# Patient Record
Sex: Male | Born: 1950 | Race: Black or African American | Hispanic: No | Smoking: Current some day smoker
Health system: Southern US, Community
[De-identification: ages and names within clinical notes are randomized; demographics above are authoritative.]

## PROBLEM LIST (undated history)

## (undated) DIAGNOSIS — C801 Malignant (primary) neoplasm, unspecified: Secondary | ICD-10-CM

## (undated) DIAGNOSIS — M1990 Unspecified osteoarthritis, unspecified site: Secondary | ICD-10-CM

## (undated) DIAGNOSIS — J189 Pneumonia, unspecified organism: Secondary | ICD-10-CM

## (undated) DIAGNOSIS — K56600 Partial intestinal obstruction, unspecified as to cause: Secondary | ICD-10-CM

## (undated) DIAGNOSIS — IMO0002 Reserved for concepts with insufficient information to code with codable children: Secondary | ICD-10-CM

## (undated) DIAGNOSIS — M329 Systemic lupus erythematosus, unspecified: Secondary | ICD-10-CM

## (undated) HISTORY — DX: Partial intestinal obstruction, unspecified as to cause: K56.600

## (undated) HISTORY — PX: NECK SURGERY: SHX720

## (undated) HISTORY — DX: Malignant (primary) neoplasm, unspecified: C80.1

## (undated) HISTORY — DX: Reserved for concepts with insufficient information to code with codable children: IMO0002

## (undated) HISTORY — DX: Systemic lupus erythematosus, unspecified: M32.9

## (undated) HISTORY — PX: BACK SURGERY: SHX140

## (undated) HISTORY — DX: Pneumonia, unspecified organism: J18.9

## (undated) HISTORY — PX: OTHER SURGICAL HISTORY: SHX169

---

## 2013-04-23 ENCOUNTER — Emergency Department (HOSPITAL_COMMUNITY): Payer: Medicaid Other

## 2013-04-23 ENCOUNTER — Encounter (HOSPITAL_COMMUNITY): Payer: Self-pay

## 2013-04-23 ENCOUNTER — Inpatient Hospital Stay (HOSPITAL_COMMUNITY)
Admission: EM | Admit: 2013-04-23 | Discharge: 2013-04-26 | DRG: 390 | Disposition: A | Discharge status: Home / Self Care | Payer: Medicaid Other | Attending: General Surgery | Admitting: General Surgery

## 2013-04-23 DIAGNOSIS — IMO0002 Reserved for concepts with insufficient information to code with codable children: Secondary | ICD-10-CM

## 2013-04-23 DIAGNOSIS — R0902 Hypoxemia: Secondary | ICD-10-CM

## 2013-04-23 DIAGNOSIS — M329 Systemic lupus erythematosus, unspecified: Secondary | ICD-10-CM | POA: Diagnosis present

## 2013-04-23 DIAGNOSIS — J984 Other disorders of lung: Secondary | ICD-10-CM | POA: Diagnosis present

## 2013-04-23 DIAGNOSIS — K56609 Unspecified intestinal obstruction, unspecified as to partial versus complete obstruction: Secondary | ICD-10-CM

## 2013-04-23 DIAGNOSIS — R911 Solitary pulmonary nodule: Secondary | ICD-10-CM

## 2013-04-23 DIAGNOSIS — K56600 Partial intestinal obstruction, unspecified as to cause: Secondary | ICD-10-CM

## 2013-04-23 DIAGNOSIS — R918 Other nonspecific abnormal finding of lung field: Secondary | ICD-10-CM

## 2013-04-23 DIAGNOSIS — Z79899 Other long term (current) drug therapy: Secondary | ICD-10-CM

## 2013-04-23 DIAGNOSIS — F17200 Nicotine dependence, unspecified, uncomplicated: Secondary | ICD-10-CM | POA: Diagnosis present

## 2013-04-23 DIAGNOSIS — K565 Intestinal adhesions [bands], unspecified as to partial versus complete obstruction: Principal | ICD-10-CM | POA: Diagnosis present

## 2013-04-23 DIAGNOSIS — R109 Unspecified abdominal pain: Secondary | ICD-10-CM

## 2013-04-23 HISTORY — DX: Unspecified osteoarthritis, unspecified site: M19.90

## 2013-04-23 HISTORY — DX: Partial intestinal obstruction, unspecified as to cause: K56.600

## 2013-04-23 LAB — POCT I-STAT, CHEM 8
BUN: 7 mg/dL (ref 6–23)
Calcium, Ion: 1.05 mmol/L — ABNORMAL LOW (ref 1.13–1.30)
Chloride: 98 mEq/L (ref 96–112)
Creatinine, Ser: 1.3 mg/dL (ref 0.50–1.35)
Glucose, Bld: 86 mg/dL (ref 70–99)
Sodium: 140 mEq/L (ref 135–145)
TCO2: 30 mmol/L (ref 0–100)

## 2013-04-23 LAB — COMPREHENSIVE METABOLIC PANEL
ALT: 14 U/L (ref 0–53)
AST: 30 U/L (ref 0–37)
Albumin: 4.2 g/dL (ref 3.5–5.2)
CO2: 26 mEq/L (ref 19–32)
Chloride: 95 mEq/L — ABNORMAL LOW (ref 96–112)
Creatinine, Ser: 0.78 mg/dL (ref 0.50–1.35)
Potassium: 3.7 mEq/L (ref 3.5–5.1)
Sodium: 139 mEq/L (ref 135–145)
Total Bilirubin: 0.4 mg/dL (ref 0.3–1.2)
Total Protein: 8.5 g/dL — ABNORMAL HIGH (ref 6.0–8.3)

## 2013-04-23 LAB — CBC
MCHC: 36 g/dL (ref 30.0–36.0)
Platelets: 146 10*3/uL — ABNORMAL LOW (ref 150–400)
RBC: 4.12 MIL/uL — ABNORMAL LOW (ref 4.22–5.81)
RDW: 14 % (ref 11.5–15.5)
WBC: 7.2 10*3/uL (ref 4.0–10.5)

## 2013-04-23 LAB — URINALYSIS, ROUTINE W REFLEX MICROSCOPIC
Glucose, UA: NEGATIVE mg/dL
Hgb urine dipstick: NEGATIVE
Ketones, ur: NEGATIVE mg/dL
Leukocytes, UA: NEGATIVE
pH: 8.5 — ABNORMAL HIGH (ref 5.0–8.0)

## 2013-04-23 LAB — POCT I-STAT TROPONIN I: Troponin i, poc: 0 ng/mL (ref 0.00–0.08)

## 2013-04-23 MED ORDER — SODIUM CHLORIDE 0.9 % IV SOLN
INTRAVENOUS | Status: DC
  Administered 2013-04-23: 900 mL via INTRAVENOUS
  Administered 2013-04-23: 05:00:00 via INTRAVENOUS

## 2013-04-23 MED ORDER — ONDANSETRON HCL 4 MG/2ML IJ SOLN
4.0000 mg | Freq: Four times a day (QID) | INTRAMUSCULAR | Status: DC | PRN
  Administered 2013-04-23: 4 mg via INTRAVENOUS
  Filled 2013-04-23: qty 2

## 2013-04-23 MED ORDER — SODIUM CHLORIDE 0.9 % IV SOLN
INTRAVENOUS | Status: DC
  Administered 2013-04-23 – 2013-04-24 (×4): via INTRAVENOUS

## 2013-04-23 MED ORDER — ENOXAPARIN SODIUM 40 MG/0.4ML ~~LOC~~ SOLN
40.0000 mg | SUBCUTANEOUS | Status: DC
  Administered 2013-04-23 – 2013-04-26 (×4): 40 mg via SUBCUTANEOUS
  Filled 2013-04-23 (×5): qty 0.4

## 2013-04-23 MED ORDER — HYDROMORPHONE HCL PF 1 MG/ML IJ SOLN
1.0000 mg | Freq: Once | INTRAMUSCULAR | Status: AC
  Administered 2013-04-23: 1 mg via INTRAVENOUS
  Filled 2013-04-23: qty 1

## 2013-04-23 MED ORDER — PANTOPRAZOLE SODIUM 40 MG IV SOLR
40.0000 mg | Freq: Every day | INTRAVENOUS | Status: DC
  Administered 2013-04-23 – 2013-04-25 (×3): 40 mg via INTRAVENOUS
  Filled 2013-04-23 (×4): qty 40

## 2013-04-23 MED ORDER — ACETAMINOPHEN 650 MG RE SUPP: 650.0000 mg | Freq: Four times a day (QID) | RECTAL | Status: DC | PRN

## 2013-04-23 MED ORDER — MORPHINE SULFATE 2 MG/ML IJ SOLN
2.0000 mg | INTRAMUSCULAR | Status: DC | PRN
  Administered 2013-04-23 – 2013-04-24 (×5): 2 mg via INTRAVENOUS
  Filled 2013-04-23 (×4): qty 1

## 2013-04-23 MED ORDER — WHITE PETROLATUM GEL
Status: AC
  Administered 2013-04-23: 12:00:00
  Filled 2013-04-23: qty 5

## 2013-04-23 MED ORDER — ACETAMINOPHEN 325 MG PO TABS: 650.0000 mg | ORAL_TABLET | Freq: Four times a day (QID) | ORAL | Status: DC | PRN

## 2013-04-23 MED ORDER — ONDANSETRON HCL 4 MG/2ML IJ SOLN
4.0000 mg | Freq: Once | INTRAMUSCULAR | Status: AC
  Administered 2013-04-23: 4 mg via INTRAVENOUS
  Filled 2013-04-23: qty 2

## 2013-04-23 MED ORDER — IOHEXOL 300 MG/ML  SOLN
80.0000 mL | Freq: Once | INTRAMUSCULAR | Status: AC | PRN
  Administered 2013-04-23: 80 mL via INTRAVENOUS

## 2013-04-23 NOTE — Progress Notes (Signed)
SCDs placed bilaterally per order, provided Incentive Spirometer and reinforced how to use, patient states has used previously, Berle Mull RN

## 2013-04-23 NOTE — ED Notes (Signed)
Pt started having abdominal pain last night about 2300 after he ate and did not have a bowel movement yesterday.  Pain is in the center of his abdomen

## 2013-04-23 NOTE — ED Notes (Signed)
Pt given a urinal and is aware a specimen is needed.

## 2013-04-23 NOTE — ED Notes (Signed)
Attempted report, said will call back in 5 mins.

## 2013-04-23 NOTE — Progress Notes (Signed)
Called Dr. Molli Knock who has agreed to see the patient regarding his 15mm left lung cavitary lesion (can not r/o malignancy), atelectasis vs consolidation in RLL, and COPD changes.
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
## 2013-04-23 NOTE — Progress Notes (Addendum)
Received to room 6N-10 from emergenecy dept, alert and oriented, significant other at bedside, placement of NGT verified by auscultation, placed to low intermittent wall suction, only scant amounts of clear drainage noted,oriented to room and unit, provided nonslip socks, urinal, and mouth care supplied, NGT secured to gown with safety pin, patient and family appear to understand plan of care, Berle Mull RN  Patient does have "skin condition" noted to top of head and forehead, scaly and lack of pigmentation, pt states is "lupus", denies treatment or diagnosis of systemic lupus, Berle Mull RN
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
## 2013-04-24 ENCOUNTER — Inpatient Hospital Stay (HOSPITAL_COMMUNITY): Payer: Medicaid Other

## 2013-04-24 LAB — CBC
HCT: 40.3 % (ref 39.0–52.0)
Hemoglobin: 14 g/dL (ref 13.0–17.0)
MCH: 32.3 pg (ref 26.0–34.0)
MCHC: 34.7 g/dL (ref 30.0–36.0)
MCV: 93.1 fL (ref 78.0–100.0)
Platelets: 152 10*3/uL (ref 150–400)
RBC: 4.33 MIL/uL (ref 4.22–5.81)
RDW: 14.5 % (ref 11.5–15.5)
WBC: 6 10*3/uL (ref 4.0–10.5)

## 2013-04-24 LAB — BASIC METABOLIC PANEL
BUN: 11 mg/dL (ref 6–23)
CO2: 24 mEq/L (ref 19–32)
Chloride: 103 mEq/L (ref 96–112)
Creatinine, Ser: 0.69 mg/dL (ref 0.50–1.35)
GFR calc non Af Amer: >90 mL/min (ref >90)
Glucose, Bld: 93 mg/dL (ref 70–99)

## 2013-04-24 NOTE — Progress Notes (Signed)
Subjective: Less abdominal pain. No nausea since NGT fell out.  Back from chest CT.  Objective: Vital signs in last 24 hours: Temp:  [98.3 F (36.8 C)-98.9 F (37.2 C)] 98.3 F (36.8 C) (10/04 0542) Pulse Rate:  [65-71] 69 (10/04 0542) Resp:  [18-20] 18 (10/04 0542) BP: (122-143)/(57-87) 143/87 mmHg (10/04 0542) SpO2:  [94 %-95 %] 95 % (10/04 0542) Weight:  [141 lb (63.957 kg)] 141 lb (63.957 kg) (10/03 1100) Last BM Date: 04/21/13  Intake/Output from previous day: 10/03 0701 - 10/04 0700 In: 1556.7 [I.V.:1526.7; NG/GT:30] Out: 1075 [Urine:775; Emesis/NG output:300] Intake/Output this shift:    ABD: soft NT ND.  BS present.   Lab Results:   Recent Labs  04/23/13 0427 04/24/13 0642  WBC 7.2 6.0  HGB 13.5 14.0  HCT 37.5* 40.3  PLT 146* 152   BMET  Recent Labs  04/23/13 0427 04/24/13 0642  NA 139 144  K 3.7 4.0  CL 95* 103  CO2 26 24  GLUCOSE 81 93  BUN 9 11  CREATININE 0.78 0.69  CALCIUM 9.8 8.6   PT/INR No results found for this basename: LABPROT, INR,  in the last 72 hours ABG No results found for this basename: PHART, PCO2, PO2, HCO3,  in the last 72 hours  Studies/Results: Ct Chest Wo Contrast  04/24/2013   CLINICAL DATA:  Followup pulmonary nodule in the left lower lobe.  EXAM: CT CHEST WITHOUT CONTRAST  TECHNIQUE: Multidetector CT imaging of the chest was performed following the standard protocol without IV contrast.  COMPARISON:  A CT 04/23/2013 (abdomen pelvis).  FINDINGS: The left lower lobe irregular cavitary lesion just above the diaphragm is again demonstrated measuring 16 mm. This lesion abuts the pleural surface. There is new consolidation within the right lower lobe with air bronchograms and scattered gas foci and volume loss which is increased in short interval. Within the right upper lobe there is a peripheral 6 mm nodule which has a pleural tag (image 26). There is underlying centrilobular emphysema in the upper lobes.  No axillary or  supraclavicular lymphadenopathy. No mediastinal hilar lymphadenopathy on this noncontrast exam. No pericardial fluid. Limited view of the upper abdomen demonstrates a dilated loops of small bowel up to 3.3 cm similar to prior. Review of the skeleton demonstrates no aggressive osseous lesion.  IMPRESSION: 1. Again demonstrated left lower lobe cavitary lesion with differential including infection versus neoplasm.  2. New consolidation in the right lower lobe with air bronchograms and volume loss. Differential includes atelectasis versus pneumonia versus aspiration pneumonitis. Pattern is concerning for infection or superinfection.  3. Small right upper lobe nodule is suspicious for neoplasm. Patient may benefit from an outpatient FDG PET/CT scan.   Electronically Signed   By: Genevive Bi M.D.   On: 04/24/2013 09:43   Ct Abdomen Pelvis W Contrast  04/23/2013   CLINICAL DATA:  Abdominal pain, vomiting.  EXAM: CT ABDOMEN AND PELVIS WITH CONTRAST  TECHNIQUE: Multidetector CT imaging of the abdomen and pelvis was performed using the standard protocol following bolus administration of intravenous contrast.  CONTRAST:  80mL OMNIPAQUE IOHEXOL 300 MG/ML  SOLN  COMPARISON:  Plain films 04/23/2013  FINDINGS: Heart is normal size. Small cavitary lesion noted in the left lung base measuring 15 mm. Atelectasis or consolidation in the right lower lobe. There appears to be debris/ mucous within the right lower lobe bronchi. No pleural effusions. COPD changes.  Small bowel loops are dilated and fluid filled. Distal small bowel loops are decompressed.  Findings compatible with small bowel obstruction. Exact transition point not identified. Gas and stool throughout nondistended colon. No free fluid, free air or adenopathy. Urinary bladder is unremarkable.  Liver, gallbladder, spleen, pancreas, adrenals and kidneys are unremarkable. Aorta is heavily calcified, non aneurysmal.  Degenerative disc disease and facet disease in the  lumbar spine. No acute findings.  IMPRESSION: Dilated fluid-filled small bowel loops with decompressed distal small bowel. Findings compatible with distal small bowel obstruction. Exact transition point or cause not identified.  15 mm cavitary nodule in the left lower lobe. Cannot exclude malignancy. This could be further evaluated with PET CT.  Debris/mucous within the right lower lobe bronchi. Right lower lobe atelectasis.   Electronically Signed   By: Charlett Nose M.D.   On: 04/23/2013 06:22   Dg Abd Acute W/chest  04/23/2013   *RADIOLOGY REPORT*  Clinical Data: Chest pain and abdominal pain  ACUTE ABDOMEN SERIES (ABDOMEN 2 VIEW & CHEST 1 VIEW)  Comparison: None.  Findings: Cardiac and mediastinal silhouettes are within normal limits.  The lungs are mildly hyperinflated with attenuation of the pulmonary markings, suggestive of COPD.  Bibasilar atelectasis or scarring is present.  No focal infiltrate, pleural effusion, or pulmonary edema.  No pneumothorax.  Gas and stool are seen scattered within several nondilated loops of bowel throughout the abdomen.  There is no evidence of obstruction or ileus.  No free air.  No soft tissue mass or abnormal calcifications.  Levoscoliosis of the lumbar spine with associated multilevel degenerative nodes are present.  No acute osseous abnormality.  IMPRESSION: 1. No radiographic evidence of obstruction or other acute abnormality within the abdomen. 2.  Findings suggestive of COPD.  Bibasilar atelectasis or scarring.   Original Report Authenticated By: Rise Mu, M.D.   Dg Abd Portable 2v  04/24/2013   CLINICAL DATA:  Followup SB0.  EXAM: PORTABLE ABDOMEN - 2 VIEW  COMPARISON:  04/23/2013 as well as CT 04/23/2013.  FINDINGS: Supine and left-side-down lateral decubitus films were obtained. There is no free peritoneal air present. There are a few air-filled mildly dilated small bowel loops with scattered air-fluid levels. Minimal air over the colon. Remainder of  the exam is unchanged.  IMPRESSION: Continued evidence of the patient's known small bowel obstructive process.   Electronically Signed   By: Elberta Fortis M.D.   On: 04/24/2013 09:08    Anti-infectives: Anti-infectives   None      Assessment/Plan:  LOS: 1 day  SBO Pulmonary nodule/  Consolidation noted on chest CT.    Per CCM NGT fell out. Pt with less abdominal pain. No BM.  Keep NGT out for now and NPO.  Films in am.   Paulyne Mooty A. 04/24/2013

## 2013-04-24 NOTE — Progress Notes (Signed)
Patient's NGT found halfway pulled out, secured only with a plastic tape on tip of nose placed in ED on 10/3. Dr. Lindie Spruce, MD on call paged and notified of NGT status; stated will re-evaluate when they do their rounds in AM. Will continue to monitor.

## 2013-04-25 LAB — CBC
HCT: 35.1 % — ABNORMAL LOW (ref 39.0–52.0)
MCH: 33.2 pg (ref 26.0–34.0)
MCV: 91.6 fL (ref 78.0–100.0)
RBC: 3.83 MIL/uL — ABNORMAL LOW (ref 4.22–5.81)
RDW: 14.2 % (ref 11.5–15.5)
WBC: 3.9 10*3/uL — ABNORMAL LOW (ref 4.0–10.5)

## 2013-04-25 MED ORDER — OXYCODONE-ACETAMINOPHEN 5-325 MG PO TABS
1.0000 | ORAL_TABLET | ORAL | Status: DC | PRN
  Administered 2013-04-25: 1 via ORAL
  Filled 2013-04-25: qty 1

## 2013-04-25 NOTE — Progress Notes (Addendum)
Subjective: Pt with 3 BM and a lot of flatus.  Pain free  Objective: Vital signs in last 24 hours: Temp:  [98.1 F (36.7 C)-99.5 F (37.5 C)] 98.6 F (37 C) (10/05 0615) Pulse Rate:  [54-59] 54 (10/05 0615) Resp:  [18] 18 (10/05 0615) BP: (121-136)/(55-74) 136/55 mmHg (10/05 0615) SpO2:  [94 %-98 %] 97 % (10/05 0615) Last BM Date: 04/24/13  Intake/Output from previous day: 10/04 0701 - 10/05 0700 In: -  Out: 450 [Urine:450] Intake/Output this shift:    Incision/Wound:soft abdomen non tender  No distension.   Lab Results:   Recent Labs  04/24/13 0642 04/25/13 0630  WBC 6.0 3.9*  HGB 14.0 12.7*  HCT 40.3 35.1*  PLT 152 142*   BMET  Recent Labs  04/23/13 0427 04/24/13 0642  NA 139 144  K 3.7 4.0  CL 95* 103  CO2 26 24  GLUCOSE 81 93  BUN 9 11  CREATININE 0.78 0.69  CALCIUM 9.8 8.6   PT/INR No results found for this basename: LABPROT, INR,  in the last 72 hours ABG No results found for this basename: PHART, PCO2, PO2, HCO3,  in the last 72 hours  Studies/Results: Dg Abd 1 View  04/24/2013   CLINICAL DATA:  Small bowel obstruction.  EXAM: ABDOMEN - 1 VIEW  COMPARISON:  04/24/2013  FINDINGS: There is persistent evidence of a partial small bowel obstruction with multiple dilated central small bowel loops present. Maximal small bowel caliber is approximately 4 cm. Some sparse air remains in the colon.  IMPRESSION: Persistent radiographic evidence of partial small bowel obstruction.   Electronically Signed   By: Irish Lack M.D.   On: 04/24/2013 15:20   Ct Chest Wo Contrast  04/24/2013   CLINICAL DATA:  Followup pulmonary nodule in the left lower lobe.  EXAM: CT CHEST WITHOUT CONTRAST  TECHNIQUE: Multidetector CT imaging of the chest was performed following the standard protocol without IV contrast.  COMPARISON:  A CT 04/23/2013 (abdomen pelvis).  FINDINGS: The left lower lobe irregular cavitary lesion just above the diaphragm is again demonstrated measuring  16 mm. This lesion abuts the pleural surface. There is new consolidation within the right lower lobe with air bronchograms and scattered gas foci and volume loss which is increased in short interval. Within the right upper lobe there is a peripheral 6 mm nodule which has a pleural tag (image 26). There is underlying centrilobular emphysema in the upper lobes.  No axillary or supraclavicular lymphadenopathy. No mediastinal hilar lymphadenopathy on this noncontrast exam. No pericardial fluid. Limited view of the upper abdomen demonstrates a dilated loops of small bowel up to 3.3 cm similar to prior. Review of the skeleton demonstrates no aggressive osseous lesion.  IMPRESSION: 1. Again demonstrated left lower lobe cavitary lesion with differential including infection versus neoplasm.  2. New consolidation in the right lower lobe with air bronchograms and volume loss. Differential includes atelectasis versus pneumonia versus aspiration pneumonitis. Pattern is concerning for infection or superinfection.  3. Small right upper lobe nodule is suspicious for neoplasm. Patient may benefit from an outpatient FDG PET/CT scan.   Electronically Signed   By: Genevive Bi M.D.   On: 04/24/2013 09:43   Dg Abd Portable 2v  04/24/2013   CLINICAL DATA:  Followup SB0.  EXAM: PORTABLE ABDOMEN - 2 VIEW  COMPARISON:  04/23/2013 as well as CT 04/23/2013.  FINDINGS: Supine and left-side-down lateral decubitus films were obtained. There is no free peritoneal air present. There are a  few air-filled mildly dilated small bowel loops with scattered air-fluid levels. Minimal air over the colon. Remainder of the exam is unchanged.  IMPRESSION: Continued evidence of the patient's known small bowel obstructive process.   Electronically Signed   By: Elberta Fortis M.D.   On: 04/24/2013 09:08    Anti-infectives: Anti-infectives   None      Assessment/Plan: s LOS: 2 days   pSBO resolving Advance diet SL IV PULMONARY ISSUES PER CCM.   May need follow up with thoracic surgery as outpatient.  Kemal Amores A. 04/25/2013

## 2013-04-26 ENCOUNTER — Telehealth: Payer: Self-pay | Admitting: Internal Medicine

## 2013-04-26 MED ORDER — LEVOFLOXACIN 500 MG PO TABS: 500.0000 mg | ORAL_TABLET | Freq: Every day | ORAL | Status: DC
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
## 2013-04-26 NOTE — Telephone Encounter (Signed)
Discharge Instructions:  Follow-up Information    Follow up with [facility], MD. (They will call you with appointment time)    MR is not here this week. He is already double booked on Monday, Tuesday and wed of next week. Please advise MR thanks

## 2013-04-26 NOTE — Telephone Encounter (Signed)
Ok for NP to see. I told Tresa Endo the NP that. Patient has RLL aspiration or mass. So I told her to Rx with levaquin and fu with [place]. As long as seen in 10-14 days by NP first is fine.  FYI: I never saw patient 04/26/2013 or this admit. Advised over phne because IW as busy and they needed to dc   Dr. Kalman Shan, M.D., [facility].C.P Pulmonary and Critical Care Medicine Staff Physician [facility] Pager: [phone], If no answer or between  15:00h - 7:00h: call [phone]  04/26/2013 6:21 PM

## 2013-04-26 NOTE — Discharge Summary (Signed)
Rusty Villella, MD, MPH, FACS Pager: [phone]  

## 2013-04-26 NOTE — Progress Notes (Signed)
Patient ID: Aaron Conway, male   DOB: 1951/05/24, 62 y.o.   MRN: [id number]    Subjective: The patient feels well today.  Tolerating his full liquids without any complaints.    Objective: Vital signs in last 24 hours: Temp:  [97.9 F (36.6 C)-99 F (37.2 C)] 98.2 F (36.8 C) (10/06 0516) Pulse Rate:  [54-60] 55 (10/06 0516) Resp:  [20-22] 20 (10/06 0516) BP: (135-147)/(66-83) 136/66 mmHg (10/06 0516) SpO2:  [94 %-98 %] 94 % (10/06 0516) Last BM Date: 04/25/13  Intake/Output from previous day: 10/05 0701 - 10/06 0700 In: 840 [P.O.:840] Out: -  Intake/Output this shift:    PE: Abd: soft, NT, ND, +BS Heart: regular Lungs: CTAB  Lab Results:   Recent Labs  04/24/13 0642 04/25/13 0630  WBC 6.0 3.9*  HGB 14.0 12.7*  HCT 40.3 35.1*  PLT 152 142*   BMET  Recent Labs  04/24/13 0642  NA 144  K 4.0  CL 103  CO2 24  GLUCOSE 93  BUN 11  CREATININE 0.69  CALCIUM 8.6   PT/INR No results found for this basename: LABPROT, INR,  in the last 72 hours CMP     Component Value Date/Time   NA 144 04/24/2013 0642   K 4.0 04/24/2013 0642   CL 103 04/24/2013 0642   CO2 24 04/24/2013 0642   GLUCOSE 93 04/24/2013 0642   BUN 11 04/24/2013 0642   CREATININE 0.69 04/24/2013 0642   CALCIUM 8.6 04/24/2013 0642   PROT 8.5* 04/23/2013 0427   ALBUMIN 4.2 04/23/2013 0427   AST 30 04/23/2013 0427   ALT 14 04/23/2013 0427   ALKPHOS 47 04/23/2013 0427   BILITOT 0.4 04/23/2013 0427   GFRNONAA >90 04/24/2013 0642   GFRAA >90 04/24/2013 0642   Lipase     Component Value Date/Time   LIPASE 16 04/23/2013 0427       Studies/Results: Dg Abd 1 View  04/24/2013   CLINICAL DATA:  Small bowel obstruction.  EXAM: ABDOMEN - 1 VIEW  COMPARISON:  04/24/2013  FINDINGS: There is persistent evidence of a partial small bowel obstruction with multiple dilated central small bowel loops present. Maximal small bowel caliber is approximately 4 cm. Some sparse air remains in the colon.  IMPRESSION: Persistent  radiographic evidence of partial small bowel obstruction.   Electronically Signed   By: Irish Lack M.D.   On: 04/24/2013 15:20   Ct Chest Wo Contrast  04/24/2013   CLINICAL DATA:  Followup pulmonary nodule in the left lower lobe.  EXAM: CT CHEST WITHOUT CONTRAST  TECHNIQUE: Multidetector CT imaging of the chest was performed following the standard protocol without IV contrast.  COMPARISON:  A CT 04/23/2013 (abdomen pelvis).  FINDINGS: The left lower lobe irregular cavitary lesion just above the diaphragm is again demonstrated measuring 16 mm. This lesion abuts the pleural surface. There is new consolidation within the right lower lobe with air bronchograms and scattered gas foci and volume loss which is increased in short interval. Within the right upper lobe there is a peripheral 6 mm nodule which has a pleural tag (image 26). There is underlying centrilobular emphysema in the upper lobes.  No axillary or supraclavicular lymphadenopathy. No mediastinal hilar lymphadenopathy on this noncontrast exam. No pericardial fluid. Limited view of the upper abdomen demonstrates a dilated loops of small bowel up to 3.3 cm similar to prior. Review of the skeleton demonstrates no aggressive osseous lesion.  IMPRESSION: 1. Again demonstrated left lower lobe cavitary lesion with differential  including infection versus neoplasm.  2. New consolidation in the right lower lobe with air bronchograms and volume loss. Differential includes atelectasis versus pneumonia versus aspiration pneumonitis. Pattern is concerning for infection or superinfection.  3. Small right upper lobe nodule is suspicious for neoplasm. Patient may benefit from an outpatient FDG PET/CT scan.   Electronically Signed   By: Genevive Bi M.D.   On: 04/24/2013 09:43    Anti-infectives: Anti-infectives   None       Assessment/Plan  1. SBO, resolving 2. Lung nodule  Plan: 1. Advance to a regular diet, if he tolerates this he is surgically  stable for dc home after lunch 2. Will await CCM evaluation today prior to dc.   LOS: 3 days    Kamran Coker E 04/26/2013, 9:14 AM Pager: [phone]

## 2013-04-26 NOTE — Progress Notes (Signed)
Home after lunch if he tolerates it. Appreciate CCM F/U of his lung mass. Patient examined and I agree with the assessment and plan  Violeta Gelinas, MD, MPH, FACS Pager: [phone]  04/26/2013 9:38 AM

## 2013-04-26 NOTE — Progress Notes (Signed)
Discharge home. Home discharge instruction given to patient, no question verbalized. 

## 2013-04-27 NOTE — Telephone Encounter (Signed)
I have lmomtcb for pt. I have held a space on TP's schedule for Friday, Oct 17 at 9:30 am.  Can pt come in then?  If not, this block will need to be removed.   Note:  Molli Knock seen pt in the hospital as consult.

## 2013-04-28 NOTE — Telephone Encounter (Signed)
Appt set for 05-10-13, pt requested this date. Carron Curie, CMA

## 2013-05-10 ENCOUNTER — Ambulatory Visit (INDEPENDENT_AMBULATORY_CARE_PROVIDER_SITE_OTHER)
Admission: RE | Admit: 2013-05-10 | Discharge: 2013-05-10 | Disposition: A | Discharge status: Home / Self Care | Payer: Medicaid Other | Source: Ambulatory Visit | Attending: Adult Health | Admitting: Adult Health

## 2013-05-10 ENCOUNTER — Encounter: Payer: Self-pay | Admitting: Adult Health

## 2013-05-10 ENCOUNTER — Ambulatory Visit (INDEPENDENT_AMBULATORY_CARE_PROVIDER_SITE_OTHER): Payer: Medicaid Other | Admitting: Adult Health

## 2013-05-10 VITALS — BP 108/68 | HR 58 | Temp 97.1°F | Ht 70.25 in | Wt 148.0 lb

## 2013-05-10 DIAGNOSIS — R911 Solitary pulmonary nodule: Secondary | ICD-10-CM

## 2013-05-10 NOTE — Patient Instructions (Addendum)
MUST QUIT SMOKING  Mucinex DM Twice daily  As needed  Cough/congestion  Advance activity as tolerated.  Cut back on drinking alcohol.  Return in 2 weeks with Dr. Sherene Sires  With follow up Chest xray .  Make sure you return for follow up visit to have chest xray .  Please contact office for sooner follow up if symptoms do not improve or worsen or seek emergency care

## 2013-05-10 NOTE — Progress Notes (Signed)
  Subjective:    Patient ID: Aaron Conway, male    DOB: January 20, 1951, 62 y.o.   MRN: [id number]  HPI 62 year old male active smoker , ETOH daily use, seen for pulmonary consult during hospitalization for her lung mass on 04/23/2013 Hx of Lupus-skin involvement  05/10/2013 Post hospital followup Patient was admitted October 3-6 , for small bowel obstruction , that resolved with conservative therapy. CT abdomen showed abnormal cavitary lung lesion. Pulmonary was consulted. A CT chest showed a left lower lobe cavitary lesion, consolidation, along the right lower lobe with air bronchograms, small right upper lobe lung nodule. Patient was treated with antibiotics, and discharged on a full course of Levaquin. Patient says since discharge. That he is improved. Stomach symptoms have totally resolved, and he has normal bowel movements. He denies any dysphagia, or overt reflux, choking episodes, hemoptysis, weight loss, nausea, vomiting, fever, or chest pain. Patient is an active smoker for greater than 49 years. However, has never carried a diagnosis of COPD. 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 He does drink alcohol on a daily basis. He smokes approximately one pack or more daily of cigarettes.      Review of Systems Constitutional:   No  weight loss, night sweats,  Fevers, chills, fatigue, or  lassitude.  HEENT:   No headaches,  Difficulty swallowing,  Tooth/dental problems, or  Sore throat,                No sneezing, itching, ear ache, nasal congestion, post nasal drip,   CV:  No chest pain,  Orthopnea, PND, swelling in lower extremities, anasarca, dizziness, palpitations, syncope.   GI  No heartburn, indigestion, abdominal pain, nausea, vomiting, diarrhea, change in bowel habits, loss of appetite, bloody stools.   Resp: No shortness of breath with exertion or at rest.  No excess mucus, no productive cough,  No non-productive cough,  No coughing up of blood.  No  change in color of mucus.  No wheezing.  No chest wall deformity  Skin: no rash or lesions.  GU: no dysuria, change in color of urine, no urgency or frequency.  No flank pain, no hematuria   MS:  Chronic neck and back pain .   Psych:  No change in mood or affect. No depression or anxiety.  No memory loss.          Objective:   Physical Exam  GEN: A/Ox3; pleasant , NAD,  Thin male   HEENT:  [place]/AT,  EACs-clear, TMs-wnl, NOSE-clear, THROAT-clear, no lesions, no postnasal drip or exudate noted. Upper plate, few teeth on bottom-poor dentition with partial plate  NECK:  Supple w/ fair ROM; no JVD; normal carotid impulses w/o bruits; no thyromegaly or nodules palpated; no lymphadenopathy.  RESP  Clear  P & A; w/o, wheezes/ rales/ or rhonchi.no accessory muscle use, no dullness to percussion  CARD:  RRR, no m/r/g  , no peripheral edema, pulses intact, no cyanosis or clubbing.  GI:   Soft & nt; nml bowel sounds; no organomegaly or masses detected.  Musco: Warm bil, no deformities or joint swelling noted.   Neuro: alert, no focal deficits noted.    Skin:  Abnormal scalp lesion -lupus related.   CXR 05/10/2013  Cavitary lesion left lower lobe has not cleared.  Consolidation right lung base remains.  Nodular density right upper lobe once again visualized.       Assessment & Plan:

## 2013-05-10 NOTE — Assessment & Plan Note (Addendum)
Abnormal lung nodules with cavitary lesion in the left lower lobe and consolidation consistent with possible pneumonia. On the right and an active smoker. Patient was treated for her possible aspiration pneumonia with Levaquin. Patient is clinically improved. Chest x-ray today shows. No change. Patient will return here in 2 weeks for followup. Chest x-ray and consideration of further testing if. No change. She was advised on smoking cessation, and decreasing his alcohol intake.  Plan  MUST QUIT SMOKING  Mucinex DM Twice daily  As needed  Cough/congestion  Advance activity as tolerated.  Cut back on drinking alcohol.  Return in 2 weeks with Dr. Sherene Sires  With follow up Chest xray .  Make sure you return for follow up visit to have chest xray .  Please contact office for sooner follow up if symptoms do not improve or worsen or seek emergency care

## 2013-05-24 ENCOUNTER — Ambulatory Visit (INDEPENDENT_AMBULATORY_CARE_PROVIDER_SITE_OTHER)
Admission: RE | Admit: 2013-05-24 | Discharge: 2013-05-24 | Disposition: A | Discharge status: Home / Self Care | Payer: Medicaid Other | Source: Ambulatory Visit | Attending: Internal Medicine | Admitting: Internal Medicine

## 2013-05-24 ENCOUNTER — Encounter: Payer: Self-pay | Admitting: Internal Medicine

## 2013-05-24 ENCOUNTER — Ambulatory Visit (INDEPENDENT_AMBULATORY_CARE_PROVIDER_SITE_OTHER): Payer: Medicaid Other | Admitting: Internal Medicine

## 2013-05-24 VITALS — BP 130/78 | HR 53 | Temp 98.0°F | Ht 70.25 in | Wt 146.0 lb

## 2013-05-24 DIAGNOSIS — F17200 Nicotine dependence, unspecified, uncomplicated: Secondary | ICD-10-CM | POA: Insufficient documentation

## 2013-05-24 DIAGNOSIS — R911 Solitary pulmonary nodule: Secondary | ICD-10-CM

## 2013-05-24 NOTE — Progress Notes (Signed)
Subjective:    Patient ID: Aaron Conway, male    DOB: 03/14/1951.   MRN: [id number]  Brief patient profile:  62 year old male active smoker , ETOH daily use, seen for pulmonary consult during hospitalization for her lung mass on 04/23/2013 Hx of Lupus-skin involvement  05/10/2013 NP Post hospital followup Patient was admitted October 3-6 , for small bowel obstruction , that resolved with conservative therapy. CT abdomen showed abnormal cavitary lung lesion. Pulmonary was consulted. A CT chest showed a left lower lobe cavitary lesion, consolidation, along the right lower lobe with air bronchograms, small right upper lobe lung nodule. Patient was treated with antibiotics, and discharged on a full course of Levaquin. Patient says since discharge. That he is improved. Stomach symptoms have totally resolved, and he has normal bowel movements. He denies any dysphagia, or overt reflux, choking episodes, hemoptysis, weight loss, nausea, vomiting, fever, or chest pain. Patient is an active smoker for greater than 49 years. However, has never carried a diagnosis of COPD. 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 He does drink alcohol on a daily basis. He smokes approximately one pack or more daily of cigarettes. rec MUST QUIT SMOKING  Mucinex DM Twice daily  As needed  Cough/congestion  Advance activity as tolerated.  Cut back on drinking alcohol.    05/24/2013 f/u ov/Wert still drinker/smoker re: cavitary lung dz p vomiting from sbo Chief Complaint  Patient presents with  . Followup with CXR    Pt states that his breathing is still doing well and he denies any new co's today.     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 First thing am mucus is white denies it ever being bloody or nasty No limiting doe but not very active and on no inhalers   No obvious day to day or daytime variabilty or assoc  cp or chest tightness,  subjective wheeze overt sinus or hb symptoms. No unusual exp hx or h/o childhood pna/ asthma or knowledge of premature birth.  Sleeping ok without nocturnal  or early am exacerbation  of respiratory  c/o's or need for noct saba. Also denies any obvious fluctuation of symptoms with weather or environmental changes or other aggravating or alleviating factors except as outlined above   Current Medications, Allergies, Complete Past Medical History, Past Surgical History, Family History, and Social History were reviewed in [employer] record.  ROS  The following are not active complaints unless bolded sore throat, dysphagia, dental problems, itching, sneezing,  nasal congestion or excess/ purulent secretions, ear ache,   fever, chills, sweats, unintended wt loss, pleuritic or exertional cp, hemoptysis,  orthopnea pnd or leg swelling, presyncope, palpitations, heartburn, abdominal pain, anorexia, nausea, vomiting, diarrhea  or change in bowel or urinary habits, change in stools or urine, dysuria,hematuria,  rash, arthralgias, visual complaints, headache, numbness weakness or ataxia or problems with walking or coordination,  change in mood/affect or memory.         Objective:   Physical Exam  GEN: A/Ox3; pleasant , NAD,  Thin amb bm nad   Wt Readings from Last 3 Encounters:  05/24/13 146 lb (66.225 kg)  05/10/13 148 lb (67.132 kg)  04/23/13 141 lb (63.957 kg)      HEENT:  [place]/AT,  EACs-clear, TMs-wnl, NOSE-clear, THROAT-clear, no lesions, no postnasal drip or exudate noted. Upper plate, few teeth on bottom-poor dentition    NECK:  Supple w/  fair ROM; no JVD; normal carotid impulses w/o bruits; no thyromegaly or nodules palpated; no lymphadenopathy.  RESP  Clear  P & A; w/o, wheezes/ rales/ or rhonchi.no accessory muscle use, no dullness to percussion  CARD:  RRR, no m/r/g  , no peripheral edema, pulses intact, no cyanosis or clubbing.  GI:   Soft & nt; nml bowel sounds;  no organomegaly or masses detected.  Musco: Warm bil, no deformities or joint swelling noted.   Neuro: alert, no focal deficits noted.    Skin:  Abnormal scalp lesion -lupus related.   CXR 05/10/2013  Cavitary lesion left lower lobe has not cleared.  Consolidation right lung base remains.  Nodular density right upper lobe once again visualized.       Assessment & Plan:

## 2013-05-24 NOTE — Assessment & Plan Note (Addendum)
CT Chest  04/24/13> showed a left lower lobe cavitary lesion, consolidation, along the right lower lobe with air bronchograms, small right upper lobe lung nodule. >>tx w/ Levaquin x 7 days  CXR 05/10/2013 no change   Multiple bilateral areas of concern following ? Asp with risk factors recent sbo, poor dentition and etohism but since he is a smoker concerned any of these lesion may turn out to be neoplasm  Discussed in detail all the  indications, usual  risks and alternatives  relative to the benefits with patient who agrees to proceed with conservative f/u and in meaintime work on smoking, drinking and dental issues, reviewed

## 2013-05-24 NOTE — Patient Instructions (Signed)
The key is to stop smoking completely before smoking completely stops you!   See your dentist if at all possible between now and return and eliminate drinking as much possible   Please schedule a follow up office visit in 6 weeks, call sooner if needed with a follow up cxr and PFTs

## 2013-05-24 NOTE — Assessment & Plan Note (Signed)
>   3 min reviewed the Flethcher curve with patient that basically indicates  if you quit smoking when your best day FEV1 is still well preserved (which his appears to be pending pft's ordered) it is highly unlikely you will progress to severe disease and informed the patient there was no medication on the market that has proven to change the curve or the likelihood of progression.  Therefore stopping smoking and maintaining abstinence is the most important aspect of care, not choice of inhalers or for that matter, doctors.

## 2013-07-01 ENCOUNTER — Other Ambulatory Visit: Payer: Self-pay | Admitting: Internal Medicine

## 2013-07-01 DIAGNOSIS — R918 Other nonspecific abnormal finding of lung field: Secondary | ICD-10-CM

## 2013-07-05 ENCOUNTER — Encounter: Payer: Self-pay | Admitting: Internal Medicine

## 2013-07-05 ENCOUNTER — Ambulatory Visit (INDEPENDENT_AMBULATORY_CARE_PROVIDER_SITE_OTHER): Payer: Medicaid Other | Admitting: Internal Medicine

## 2013-07-05 ENCOUNTER — Telehealth: Payer: Self-pay | Admitting: Internal Medicine

## 2013-07-05 ENCOUNTER — Ambulatory Visit (INDEPENDENT_AMBULATORY_CARE_PROVIDER_SITE_OTHER)
Admission: RE | Admit: 2013-07-05 | Discharge: 2013-07-05 | Disposition: A | Discharge status: Home / Self Care | Payer: Medicaid Other | Source: Ambulatory Visit | Attending: Internal Medicine | Admitting: Internal Medicine

## 2013-07-05 VITALS — BP 122/76 | HR 60 | Temp 97.4°F | Ht 70.5 in | Wt 154.0 lb

## 2013-07-05 DIAGNOSIS — F17200 Nicotine dependence, unspecified, uncomplicated: Secondary | ICD-10-CM

## 2013-07-05 DIAGNOSIS — R918 Other nonspecific abnormal finding of lung field: Secondary | ICD-10-CM

## 2013-07-05 DIAGNOSIS — R0902 Hypoxemia: Secondary | ICD-10-CM

## 2013-07-05 LAB — PULMONARY FUNCTION TEST
DL/VA % pred: 78 %
DL/VA: 3.62 ml/min/mmHg/L
DLCO unc % pred: 61 %
FEF 25-75 Post: 2.05 L/sec
FEF 25-75 Pre: 1.65 L/sec
FEF2575-%Change-Post: 24 %
FEF2575-%Pred-Post: 73 %
FEF2575-%Pred-Pre: 59 %
FEV1-%Change-Post: 6 %
FEV1-%Pred-Post: 90 %
FEV1-%Pred-Pre: 85 %
FEV1-Pre: 2.63 L
FEV1FVC-%Change-Post: 2 %
FEV1FVC-%Pred-Pre: 89 %
FEV6-%Pred-Post: 99 %
FEV6-%Pred-Pre: 96 %
FEV6-Post: 3.84 L
FEV6FVC-%Change-Post: 0 %
FEV6FVC-%Pred-Pre: 102 %
FVC-%Change-Post: 3 %
FVC-%Pred-Post: 97 %
FVC-%Pred-Pre: 93 %
FVC-Post: 3.91 L
Post FEV1/FVC ratio: 71 %
Pre FEV1/FVC ratio: 70 %
RV % pred: 59 %
RV: 1.4 L
TLC: 5.91 L

## 2013-07-05 NOTE — Assessment & Plan Note (Signed)
07/05/2013  Walked RA x 3 laps @ 185 ft each stopped due to end of study,  sats still 91%  Acute and related to prob asp pna, resolved.

## 2013-07-05 NOTE — Progress Notes (Signed)
Subjective:    Patient ID: Aaron Conway, male    DOB: 1950/08/11.   MRN: [id number]  Brief patient profile:  63 year old male active smoker , ETOH daily use, seen for pulmonary consult during hospitalization for  lung mass on 04/23/2013 Hx of Lupus-skin involvement  05/10/2013 NP Post hospital followup Patient was admitted October 3-6 , for small bowel obstruction , that resolved with conservative therapy. CT abdomen showed abnormal cavitary lung lesion. Pulmonary was consulted. A CT chest showed a left lower lobe cavitary lesion, consolidation, along the right lower lobe with air bronchograms, small right upper lobe lung nodule. Patient was treated with antibiotics, and discharged on a full course of Levaquin. Patient says since discharge  improved. Stomach symptoms have totally resolved, and he has normal bowel movements. He denies any dysphagia, or overt reflux, choking episodes, hemoptysis, weight loss, nausea, vomiting, fever, or chest pain. Patient is an active smoker for greater than 49 years. However, has never carried a diagnosis of COPD. 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 He does drink alcohol on a daily basis. He smokes approximately one pack or more daily of cigarettes. rec MUST QUIT SMOKING  Mucinex DM Twice daily  As needed  Cough/congestion  Advance activity as tolerated.  Cut back on drinking alcohol.    05/24/2013 f/u ov/Aaron Conway still drinker/smoker re: cavitary lung dz p vomiting from sbo Chief Complaint  Patient presents with  . Followup with CXR    Pt states that his breathing is still doing well and he denies any new co's today.    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 First thing am mucus is white denies it ever being bloody or nasty No limiting doe but not very active and on no inhalers  rec Stop etoh/ smoking   07/05/2013 f/u ov/Aaron Conway re:  Chief Complaint  Patient presents  with  . Followup with PFT    Pt states his breathing is doing well and denies any new co's today.   cough is less, mostly in am's no nasty or bloody mucus, not limited by sob on no meds  No obvious day to day or daytime variabilty or assoc  cp or chest tightness, subjective wheeze overt sinus or hb symptoms. No unusual exp hx or h/o childhood pna/ asthma or knowledge of premature birth.  Sleeping ok without nocturnal  or early am exacerbation  of respiratory  c/o's or need for noct saba. Also denies any obvious fluctuation of symptoms with weather or environmental changes or other aggravating or alleviating factors except as outlined above   Current Medications, Allergies, Complete Past Medical History, Past Surgical History, Family History, and Social History were reviewed in [employer] record.  ROS  The following are not active complaints unless bolded sore throat, dysphagia, dental problems, itching, sneezing,  nasal congestion or excess/ purulent secretions, ear ache,   fever, chills, sweats, unintended wt loss, pleuritic or exertional cp, hemoptysis,  orthopnea pnd or leg swelling, presyncope, palpitations, heartburn, abdominal pain, anorexia, nausea, vomiting, diarrhea  or change in bowel or urinary habits, change in stools or urine, dysuria,hematuria,  rash, arthralgias, visual complaints, headache, numbness weakness or ataxia or problems with walking or coordination,  change in mood/affect or memory.         Objective:   Physical Exam  GEN: A/Ox3; pleasant , NAD,  Thin amb bm nad  Wt Readings from Last 3  Encounters:  07/05/13 154 lb (69.854 kg)  05/24/13 146 lb (66.225 kg)  05/10/13 148 lb (67.132 kg)         HEENT:  [place]/AT,  EACs-clear, TMs-wnl, NOSE-clear, THROAT-clear, no lesions, no postnasal drip or exudate noted. Upper plate, few teeth on bottom-poor dentition    NECK:  Supple w/ fair ROM; no JVD; normal carotid impulses w/o bruits; no thyromegaly or  nodules palpated; no lymphadenopathy.  RESP  Clear  P & A; w/o, wheezes/ rales/ or rhonchi.no accessory muscle use, no dullness to percussion  CARD:  RRR, no m/r/g  , no peripheral edema, pulses intact, no cyanosis or clubbing.  GI:   Soft & nt; nml bowel sounds; no organomegaly or masses detected.  Musco: Warm bil, no deformities or joint swelling noted.   Neuro: alert, no focal deficits noted.    Skin:  Abnormal scalp lesion -lupus related.      CXR  07/05/2013 :  Enlarging left lower lobe mass like area currently measuring 3 cm compared with the prior exam at which time this area measures 16 mm. This may represent an area of pneumonia including atypical infection given the rapid radiographic progression. Follow-up CT chest is recommended following completion of antimicrobial therapy.       Assessment & Plan:

## 2013-07-05 NOTE — Assessment & Plan Note (Signed)

## 2013-07-05 NOTE — Progress Notes (Signed)
PFT done today. 

## 2013-07-05 NOTE — Telephone Encounter (Signed)
Dr Sherene Sires, did you call this pt? Please advise or call him back at [phone] Thanks!

## 2013-07-05 NOTE — Patient Instructions (Addendum)
Please remember to go to the  x-ray department downstairs for your tests - we will call you with the results when they are available.     

## 2013-07-05 NOTE — Assessment & Plan Note (Signed)
CT Chest  04/24/13> showed a left lower lobe cavitary lesion, consolidation, along the right lower lobe with air bronchograms, small right upper lobe lung nodule. >>tx w/ Levaquin x 7 days  CXR 05/10/2013 no change > worse 07/05/2013 > rec PET  - PFT's nl except for small airways and DLCO 61 corrects to 78%   Discussed in detail all the  indications, usual  risks and alternatives  relative to the benefits with patient who agrees to proceed with PET scan.

## 2013-07-06 ENCOUNTER — Other Ambulatory Visit: Payer: Self-pay | Admitting: Internal Medicine

## 2013-07-06 ENCOUNTER — Encounter: Payer: Self-pay | Admitting: Internal Medicine

## 2013-07-06 DIAGNOSIS — R918 Other nonspecific abnormal finding of lung field: Secondary | ICD-10-CM

## 2013-07-06 NOTE — Telephone Encounter (Signed)
Done > advised PET/ scheduled

## 2013-07-06 NOTE — Telephone Encounter (Signed)
Done

## 2013-07-16 ENCOUNTER — Encounter (HOSPITAL_COMMUNITY)
Admission: RE | Admit: 2013-07-16 | Discharge: 2013-07-16 | Disposition: A | Discharge status: Home / Self Care | Payer: Medicaid Other | Source: Ambulatory Visit | Attending: Internal Medicine | Admitting: Internal Medicine

## 2013-07-16 DIAGNOSIS — R918 Other nonspecific abnormal finding of lung field: Secondary | ICD-10-CM | POA: Insufficient documentation

## 2013-07-16 LAB — GLUCOSE, CAPILLARY: Glucose-Capillary: 82 mg/dL (ref 70–99)

## 2013-07-16 MED ORDER — FLUDEOXYGLUCOSE F - 18 (FDG) INJECTION
18.4000 | Freq: Once | INTRAVENOUS | Status: AC | PRN
  Administered 2013-07-16: 18.4 via INTRAVENOUS

## 2013-07-18 ENCOUNTER — Encounter: Payer: Self-pay | Admitting: Internal Medicine

## 2013-07-19 ENCOUNTER — Telehealth: Payer: Self-pay | Admitting: Internal Medicine

## 2013-07-19 ENCOUNTER — Other Ambulatory Visit: Payer: Self-pay | Admitting: Internal Medicine

## 2013-07-19 DIAGNOSIS — R918 Other nonspecific abnormal finding of lung field: Secondary | ICD-10-CM

## 2013-07-19 NOTE — Telephone Encounter (Signed)
C/D 07/19/13 for appt. 07/20/13

## 2013-07-19 NOTE — Telephone Encounter (Signed)
Pt aware of his PET scan results. Requests that PET results be sent to insurance company  Upcoming appt with Dr Si Gaul fax results to [phone] to attn bella ross

## 2013-07-20 ENCOUNTER — Other Ambulatory Visit: Payer: Self-pay | Admitting: *Deleted

## 2013-07-20 ENCOUNTER — Ambulatory Visit (HOSPITAL_BASED_OUTPATIENT_CLINIC_OR_DEPARTMENT_OTHER): Payer: Medicaid Other | Admitting: Internal Medicine

## 2013-07-20 ENCOUNTER — Ambulatory Visit: Payer: Medicaid Other

## 2013-07-20 ENCOUNTER — Encounter: Payer: Self-pay | Admitting: Internal Medicine

## 2013-07-20 ENCOUNTER — Other Ambulatory Visit (HOSPITAL_BASED_OUTPATIENT_CLINIC_OR_DEPARTMENT_OTHER): Payer: Medicaid Other

## 2013-07-20 VITALS — BP 151/91 | HR 54 | Temp 97.1°F | Resp 18 | Ht 60.5 in | Wt 155.9 lb

## 2013-07-20 DIAGNOSIS — R918 Other nonspecific abnormal finding of lung field: Secondary | ICD-10-CM

## 2013-07-20 LAB — COMPREHENSIVE METABOLIC PANEL (CC13)
ALT: 14 U/L (ref 0–55)
AST: 19 U/L (ref 5–34)
Alkaline Phosphatase: 42 U/L (ref 40–150)
Anion Gap: 12 mEq/L — ABNORMAL HIGH (ref 3–11)
CO2: 25 mEq/L (ref 22–29)
Calcium: 9.2 mg/dL (ref 8.4–10.4)
Chloride: 103 mEq/L (ref 98–109)
Creatinine: 0.8 mg/dL (ref 0.7–1.3)
Sodium: 139 mEq/L (ref 136–145)
Total Protein: 7.9 g/dL (ref 6.4–8.3)

## 2013-07-20 LAB — CBC WITH DIFFERENTIAL/PLATELET
BASO%: 0.4 % (ref 0.0–2.0)
Eosinophils Absolute: 0.1 10*3/uL (ref 0.0–0.5)
HGB: 13.8 g/dL (ref 13.0–17.1)
LYMPH%: 63.1 % — ABNORMAL HIGH (ref 14.0–49.0)
MCH: 32.5 pg (ref 27.2–33.4)
MCHC: 34.2 g/dL (ref 32.0–36.0)
MCV: 94.8 fL (ref 79.3–98.0)
MONO#: 0.3 10*3/uL (ref 0.1–0.9)
NEUT#: 0.6 10*3/uL — ABNORMAL LOW (ref 1.5–6.5)
Platelets: 140 10*3/uL (ref 140–400)
RBC: 4.25 10*6/uL (ref 4.20–5.82)
RDW: 14.6 % (ref 11.0–14.6)
WBC: 2.7 10*3/uL — ABNORMAL LOW (ref 4.0–10.3)
lymph#: 1.7 10*3/uL (ref 0.9–3.3)
nRBC: 0 % (ref 0–0)

## 2013-07-20 NOTE — Patient Instructions (Signed)
Followup visit in 2 weeks. 
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
## 2013-07-21 ENCOUNTER — Telehealth: Payer: Self-pay | Admitting: *Deleted

## 2013-07-21 NOTE — Telephone Encounter (Signed)
sw pt gv appt for 08/09/13 w/ labs@ 9am and ov@ 9:30am. Pt is aware...td

## 2013-07-29 ENCOUNTER — Telehealth: Payer: Self-pay | Admitting: *Deleted

## 2013-07-29 NOTE — Telephone Encounter (Signed)
Called spoke with pt regarding appt with Dr. Roxan Hockey 08/02/13 at 3:00.  Gave him address of appt.  He verbalized understanding of time and place of appt

## 2013-08-02 ENCOUNTER — Other Ambulatory Visit: Payer: Self-pay | Admitting: *Deleted

## 2013-08-02 ENCOUNTER — Encounter: Payer: Self-pay | Admitting: Thoracic Surgery (Cardiothoracic Vascular Surgery)

## 2013-08-02 ENCOUNTER — Institutional Professional Consult (permissible substitution) (INDEPENDENT_AMBULATORY_CARE_PROVIDER_SITE_OTHER): Payer: Medicaid Other | Admitting: Thoracic Surgery (Cardiothoracic Vascular Surgery)

## 2013-08-02 VITALS — BP 160/97 | HR 63 | Resp 20 | Ht 65.0 in | Wt 155.0 lb

## 2013-08-02 DIAGNOSIS — R911 Solitary pulmonary nodule: Secondary | ICD-10-CM

## 2013-08-02 DIAGNOSIS — R918 Other nonspecific abnormal finding of lung field: Secondary | ICD-10-CM
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
## 2013-08-03 ENCOUNTER — Encounter (HOSPITAL_COMMUNITY): Payer: Self-pay | Admitting: Pharmacy Technician

## 2013-08-03 ENCOUNTER — Encounter: Payer: Self-pay | Admitting: *Deleted

## 2013-08-06 ENCOUNTER — Encounter (HOSPITAL_COMMUNITY)
Admission: RE | Admit: 2013-08-06 | Discharge: 2013-08-06 | Disposition: A | Discharge status: Home / Self Care | Payer: Medicaid Other | Source: Ambulatory Visit | Attending: Thoracic Surgery (Cardiothoracic Vascular Surgery) | Admitting: Thoracic Surgery (Cardiothoracic Vascular Surgery)

## 2013-08-06 ENCOUNTER — Encounter (HOSPITAL_COMMUNITY): Payer: Self-pay

## 2013-08-06 VITALS — BP 151/89 | HR 57 | Temp 97.5°F | Resp 14 | Ht 65.0 in | Wt 154.3 lb

## 2013-08-06 DIAGNOSIS — Z01818 Encounter for other preprocedural examination: Secondary | ICD-10-CM | POA: Insufficient documentation

## 2013-08-06 DIAGNOSIS — Z01810 Encounter for preprocedural cardiovascular examination: Secondary | ICD-10-CM | POA: Insufficient documentation

## 2013-08-06 DIAGNOSIS — Z01812 Encounter for preprocedural laboratory examination: Secondary | ICD-10-CM | POA: Insufficient documentation

## 2013-08-06 DIAGNOSIS — R911 Solitary pulmonary nodule: Secondary | ICD-10-CM

## 2013-08-06 LAB — URINALYSIS, ROUTINE W REFLEX MICROSCOPIC
BILIRUBIN URINE: NEGATIVE
GLUCOSE, UA: NEGATIVE mg/dL
HGB URINE DIPSTICK: NEGATIVE
Ketones, ur: NEGATIVE mg/dL
Leukocytes, UA: NEGATIVE
Nitrite: NEGATIVE
PH: 5.5 (ref 5.0–8.0)
Protein, ur: NEGATIVE mg/dL
Specific Gravity, Urine: 1.021 (ref 1.005–1.030)
Urobilinogen, UA: 1 mg/dL (ref 0.0–1.0)

## 2013-08-06 LAB — ABO/RH: ABO/RH(D): A POS

## 2013-08-06 LAB — COMPREHENSIVE METABOLIC PANEL
ALK PHOS: 45 U/L (ref 39–117)
ALT: 15 U/L (ref 0–53)
AST: 26 U/L (ref 0–37)
Albumin: 3.8 g/dL (ref 3.5–5.2)
BILIRUBIN TOTAL: 0.2 mg/dL — AB (ref 0.3–1.2)
BUN: 13 mg/dL (ref 6–23)
CHLORIDE: 101 meq/L (ref 96–112)
CO2: 20 mEq/L (ref 19–32)
Calcium: 9.1 mg/dL (ref 8.4–10.5)
Creatinine, Ser: 0.63 mg/dL (ref 0.50–1.35)
GFR calc Af Amer: >90 mL/min (ref >90)
Glucose, Bld: 91 mg/dL (ref 70–99)
POTASSIUM: 4.4 meq/L (ref 3.7–5.3)
Sodium: 137 mEq/L (ref 137–147)
Total Protein: 8.1 g/dL (ref 6.0–8.3)

## 2013-08-06 LAB — TYPE AND SCREEN
ABO/RH(D): A POS
Antibody Screen: NEGATIVE

## 2013-08-06 LAB — BLOOD GAS, ARTERIAL
Acid-Base Excess: 0.4 mmol/L (ref 0.0–2.0)
Bicarbonate: 23.7 mEq/L (ref 20.0–24.0)
FIO2: 0.21 %
O2 Saturation: 98.5 %
PH ART: 7.463 — AB (ref 7.350–7.450)
PO2 ART: 105 mmHg — AB (ref 80.0–100.0)
Patient temperature: 98.6
TCO2: 24.8 mmol/L (ref 0–100)
pCO2 arterial: 33.6 mmHg — ABNORMAL LOW (ref 35.0–45.0)

## 2013-08-06 LAB — CBC
HEMATOCRIT: 39 % (ref 39.0–52.0)
Hemoglobin: 13.9 g/dL (ref 13.0–17.0)
MCH: 33.3 pg (ref 26.0–34.0)
MCHC: 35.6 g/dL (ref 30.0–36.0)
MCV: 93.3 fL (ref 78.0–100.0)
Platelets: 108 10*3/uL — ABNORMAL LOW (ref 150–400)
RBC: 4.18 MIL/uL — ABNORMAL LOW (ref 4.22–5.81)
RDW: 14.2 % (ref 11.5–15.5)
WBC: 3 10*3/uL — AB (ref 4.0–10.5)

## 2013-08-06 LAB — PROTIME-INR
INR: 0.95 (ref 0.00–1.49)
PROTHROMBIN TIME: 12.5 s (ref 11.6–15.2)

## 2013-08-06 LAB — SURGICAL PCR SCREEN
MRSA, PCR: POSITIVE — AB
Staphylococcus aureus: POSITIVE — AB

## 2013-08-06 LAB — APTT: aPTT: 29 seconds (ref 24–37)

## 2013-08-06 NOTE — Pre-Procedure Instructions (Signed)
Javi Bollman  08/06/2013   Your procedure is scheduled on:  Monday, January 19.  Report to [facility], Main Entrance [facility]"at 8:30AM.  Call this number if you have problems the morning of surgery: [phone]   Remember:   Do not eat food or drink liquids after midnight.   Take these medicines the morning of surgery with A SIP OF WATER: none   Do not wear jewelry, make-up or nail polish.  Do not wear lotions, powders, or perfumes. You may wear deodorant.   Men may shave face and neck.  Do not bring valuables to the hospital.  [facility] is not responsible for any belongings or valuables.   Stop taking naproxen sodium (ANAPROX).              Contacts, dentures or bridgework may not be worn into surgery.  Leave suitcase in the car. After surgery it may be brought to your room.  For patients admitted to the hospital, discharge time is determined by your  treatment team.                 Special Instructions: Shower using CHG 2 nights before surgery and the night before surgery.  If you shower the day of surgery use CHG.  Use special wash - you have one bottle of CHG for all showers.  You should use approximately 1/3 of the bottle for each shower.   Please read over the following fact sheets that you were given: Pain Booklet, Coughing and Deep Breathing, Blood Transfusion Information and Surgical Site Infection Prevention

## 2013-08-08 ENCOUNTER — Encounter (HOSPITAL_COMMUNITY): Payer: Self-pay | Admitting: Certified Registered Nurse Anesthetist

## 2013-08-08 MED ORDER — DEXTROSE 5 % IV SOLN
1.5000 g | INTRAVENOUS | Status: AC
  Administered 2013-08-09 (×2): 1.5 g via INTRAVENOUS
  Filled 2013-08-08: qty 1.5

## 2013-08-09 ENCOUNTER — Inpatient Hospital Stay (HOSPITAL_COMMUNITY): Payer: Medicaid Other | Admitting: Certified Registered Nurse Anesthetist

## 2013-08-09 ENCOUNTER — Inpatient Hospital Stay (HOSPITAL_COMMUNITY): Payer: Medicaid Other

## 2013-08-09 ENCOUNTER — Inpatient Hospital Stay (HOSPITAL_COMMUNITY)
Admission: RE | Admit: 2013-08-09 | Discharge: 2013-08-14 | DRG: 164 | Disposition: A | Discharge status: Home / Self Care | Payer: Medicaid Other | Source: Ambulatory Visit | Attending: Thoracic Surgery (Cardiothoracic Vascular Surgery) | Admitting: Thoracic Surgery (Cardiothoracic Vascular Surgery)

## 2013-08-09 ENCOUNTER — Encounter: Payer: Self-pay | Admitting: Internal Medicine

## 2013-08-09 ENCOUNTER — Other Ambulatory Visit: Payer: Medicaid Other

## 2013-08-09 ENCOUNTER — Ambulatory Visit: Payer: Medicaid Other | Admitting: Internal Medicine

## 2013-08-09 ENCOUNTER — Encounter (HOSPITAL_COMMUNITY)
Admission: RE | Disposition: A | Discharge status: Home / Self Care | Payer: Self-pay | Source: Ambulatory Visit | Attending: Thoracic Surgery (Cardiothoracic Vascular Surgery)

## 2013-08-09 ENCOUNTER — Encounter (HOSPITAL_COMMUNITY): Payer: Medicaid Other | Admitting: Certified Registered Nurse Anesthetist

## 2013-08-09 ENCOUNTER — Encounter (HOSPITAL_COMMUNITY): Payer: Self-pay | Admitting: *Deleted

## 2013-08-09 DIAGNOSIS — D696 Thrombocytopenia, unspecified: Secondary | ICD-10-CM | POA: Diagnosis present

## 2013-08-09 DIAGNOSIS — I4949 Other premature depolarization: Secondary | ICD-10-CM | POA: Diagnosis not present

## 2013-08-09 DIAGNOSIS — C3430 Malignant neoplasm of lower lobe, unspecified bronchus or lung: Secondary | ICD-10-CM

## 2013-08-09 DIAGNOSIS — R911 Solitary pulmonary nodule: Secondary | ICD-10-CM

## 2013-08-09 DIAGNOSIS — E876 Hypokalemia: Secondary | ICD-10-CM | POA: Diagnosis not present

## 2013-08-09 DIAGNOSIS — M329 Systemic lupus erythematosus, unspecified: Secondary | ICD-10-CM | POA: Diagnosis present

## 2013-08-09 DIAGNOSIS — D62 Acute posthemorrhagic anemia: Secondary | ICD-10-CM | POA: Diagnosis not present

## 2013-08-09 DIAGNOSIS — F17200 Nicotine dependence, unspecified, uncomplicated: Secondary | ICD-10-CM | POA: Diagnosis present

## 2013-08-09 DIAGNOSIS — R918 Other nonspecific abnormal finding of lung field: Secondary | ICD-10-CM | POA: Diagnosis present

## 2013-08-09 DIAGNOSIS — I10 Essential (primary) hypertension: Secondary | ICD-10-CM | POA: Diagnosis present

## 2013-08-09 DIAGNOSIS — M129 Arthropathy, unspecified: Secondary | ICD-10-CM | POA: Diagnosis present

## 2013-08-09 DIAGNOSIS — K5900 Constipation, unspecified: Secondary | ICD-10-CM | POA: Diagnosis not present

## 2013-08-09 HISTORY — PX: VIDEO ASSISTED THORACOSCOPY (VATS)/WEDGE RESECTION: SHX6174

## 2013-08-09 SURGERY — VIDEO ASSISTED THORACOSCOPY (VATS)/WEDGE RESECTION
Anesthesia: General | Site: Chest | Laterality: Left

## 2013-08-09 MED ORDER — ACETAMINOPHEN 160 MG/5ML PO SOLN: 1000.0000 mg | Freq: Four times a day (QID) | ORAL | Status: AC

## 2013-08-09 MED ORDER — HYDROMORPHONE HCL PF 1 MG/ML IJ SOLN
INTRAMUSCULAR | Status: AC
  Filled 2013-08-09: qty 1

## 2013-08-09 MED ORDER — ONDANSETRON HCL 4 MG/2ML IJ SOLN: 4.0000 mg | Freq: Four times a day (QID) | INTRAMUSCULAR | Status: DC | PRN

## 2013-08-09 MED ORDER — LACTATED RINGERS IV SOLN
INTRAVENOUS | Status: DC | PRN
  Administered 2013-08-09: 10:00:00 via INTRAVENOUS

## 2013-08-09 MED ORDER — MUPIROCIN 2 % EX OINT
TOPICAL_OINTMENT | CUTANEOUS | Status: AC
  Administered 2013-08-09: 1 via NASAL
  Filled 2013-08-09: qty 22

## 2013-08-09 MED ORDER — HEMOSTATIC AGENTS (NO CHARGE) OPTIME
TOPICAL | Status: DC | PRN
  Administered 2013-08-09: 1 via TOPICAL

## 2013-08-09 MED ORDER — SENNOSIDES-DOCUSATE SODIUM 8.6-50 MG PO TABS
1.0000 | ORAL_TABLET | Freq: Every evening | ORAL | Status: DC | PRN
  Administered 2013-08-14: 1 via ORAL
  Filled 2013-08-09: qty 1

## 2013-08-09 MED ORDER — MUPIROCIN 2 % EX OINT
TOPICAL_OINTMENT | Freq: Two times a day (BID) | CUTANEOUS | Status: AC
  Administered 2013-08-09: 1 via NASAL
  Administered 2013-08-09 – 2013-08-13 (×9): via NASAL
  Filled 2013-08-09: qty 22

## 2013-08-09 MED ORDER — CHLORHEXIDINE GLUCONATE 4 % EX LIQD: 1.0000 "application " | Freq: Once | CUTANEOUS | Status: DC

## 2013-08-09 MED ORDER — OXYCODONE HCL 5 MG/5ML PO SOLN: 5.0000 mg | Freq: Once | ORAL | Status: DC | PRN

## 2013-08-09 MED ORDER — HYDROMORPHONE HCL PF 1 MG/ML IJ SOLN
0.2500 mg | INTRAMUSCULAR | Status: DC | PRN
  Administered 2013-08-09: 1 mg via INTRAVENOUS

## 2013-08-09 MED ORDER — FENTANYL CITRATE 0.05 MG/ML IJ SOLN
INTRAMUSCULAR | Status: DC | PRN
  Administered 2013-08-09 (×2): 50 ug via INTRAVENOUS
  Administered 2013-08-09: 100 ug via INTRAVENOUS
  Administered 2013-08-09 (×7): 50 ug via INTRAVENOUS

## 2013-08-09 MED ORDER — MIDAZOLAM HCL 5 MG/5ML IJ SOLN
INTRAMUSCULAR | Status: DC | PRN
  Administered 2013-08-09: 2 mg via INTRAVENOUS

## 2013-08-09 MED ORDER — ONDANSETRON HCL 4 MG/2ML IJ SOLN: 4.0000 mg | Freq: Once | INTRAMUSCULAR | Status: DC | PRN

## 2013-08-09 MED ORDER — BUPIVACAINE ON-Q PAIN PUMP (FOR ORDER SET NO CHG)
INJECTION | Status: AC
  Filled 2013-08-09: qty 1

## 2013-08-09 MED ORDER — FENTANYL 10 MCG/ML IV SOLN
INTRAVENOUS | Status: AC
  Administered 2013-08-09: 16:00:00 via INTRAVENOUS
  Administered 2013-08-09: 100 ug via INTRAVENOUS
  Administered 2013-08-10: 267 ug via INTRAVENOUS
  Administered 2013-08-10: 02:00:00 via INTRAVENOUS
  Administered 2013-08-10: 130 ug via INTRAVENOUS
  Administered 2013-08-10: 150 ug via INTRAVENOUS
  Administered 2013-08-10: 170 ug via INTRAVENOUS
  Administered 2013-08-10: 50 ug via INTRAVENOUS
  Administered 2013-08-10: 13:00:00 via INTRAVENOUS
  Administered 2013-08-10: 200 ug via INTRAVENOUS
  Administered 2013-08-11: 50 ug via INTRAVENOUS
  Administered 2013-08-11: 110 ug via INTRAVENOUS
  Administered 2013-08-11: 160 ug via INTRAVENOUS
  Administered 2013-08-11: 60 ug via INTRAVENOUS
  Administered 2013-08-11: 160 ug via INTRAVENOUS
  Administered 2013-08-11: 170 ug/h via INTRAVENOUS
  Administered 2013-08-12: 50 ug via INTRAVENOUS
  Administered 2013-08-12: 02:00:00 via INTRAVENOUS
  Administered 2013-08-12: 10 ug via INTRAVENOUS
  Administered 2013-08-12: 60 ug via INTRAVENOUS
  Administered 2013-08-12: 50 ug via INTRAVENOUS
  Administered 2013-08-12: 80 ug via INTRAVENOUS
  Administered 2013-08-12: 50 ug via INTRAVENOUS
  Administered 2013-08-13 (×2): 70 ug via INTRAVENOUS
  Administered 2013-08-13: 80 ug via INTRAVENOUS
  Administered 2013-08-13: 40 ug via INTRAVENOUS
  Filled 2013-08-09 (×5): qty 50

## 2013-08-09 MED ORDER — LIDOCAINE HCL (CARDIAC) 20 MG/ML IV SOLN
INTRAVENOUS | Status: DC | PRN
  Administered 2013-08-09: 80 mg via INTRAVENOUS

## 2013-08-09 MED ORDER — ACETAMINOPHEN 500 MG PO TABS
1000.0000 mg | ORAL_TABLET | Freq: Four times a day (QID) | ORAL | Status: AC
  Administered 2013-08-09 – 2013-08-10 (×3): 1000 mg via ORAL
  Filled 2013-08-09 (×3): qty 2

## 2013-08-09 MED ORDER — OXYCODONE-ACETAMINOPHEN 5-325 MG PO TABS
1.0000 | ORAL_TABLET | ORAL | Status: DC | PRN
  Administered 2013-08-13: 1 via ORAL
  Filled 2013-08-09 (×3): qty 1
  Filled 2013-08-09 (×2): qty 2

## 2013-08-09 MED ORDER — BUPIVACAINE 0.5 % ON-Q PUMP SINGLE CATH 400 ML
INJECTION | Status: DC | PRN
  Administered 2013-08-09: 400 mL

## 2013-08-09 MED ORDER — 0.9 % SODIUM CHLORIDE (POUR BTL) OPTIME
TOPICAL | Status: DC | PRN
  Administered 2013-08-09: 3000 mL

## 2013-08-09 MED ORDER — KETOROLAC TROMETHAMINE 30 MG/ML IJ SOLN: 15.0000 mg | Freq: Once | INTRAMUSCULAR | Status: DC | PRN

## 2013-08-09 MED ORDER — ONDANSETRON HCL 4 MG/2ML IJ SOLN
INTRAMUSCULAR | Status: DC | PRN
  Administered 2013-08-09: 4 mg via INTRAVENOUS

## 2013-08-09 MED ORDER — PHENYLEPHRINE HCL 10 MG/ML IJ SOLN
10.0000 mg | INTRAVENOUS | Status: DC | PRN
  Administered 2013-08-09: 25 ug/min via INTRAVENOUS

## 2013-08-09 MED ORDER — DIPHENHYDRAMINE HCL 12.5 MG/5ML PO ELIX
12.5000 mg | ORAL_SOLUTION | Freq: Four times a day (QID) | ORAL | Status: DC | PRN
  Filled 2013-08-09: qty 5

## 2013-08-09 MED ORDER — SODIUM CHLORIDE 0.9 % IJ SOLN: 9.0000 mL | INTRAMUSCULAR | Status: DC | PRN

## 2013-08-09 MED ORDER — LACTATED RINGERS IV SOLN
INTRAVENOUS | Status: DC | PRN
  Administered 2013-08-09 (×2): via INTRAVENOUS

## 2013-08-09 MED ORDER — DEXTROSE 5 % IV SOLN
1.5000 g | INTRAVENOUS | Status: DC
Start: 2013-08-09 — End: 2013-08-09
  Filled 2013-08-09: qty 1.5

## 2013-08-09 MED ORDER — PROPOFOL 10 MG/ML IV BOLUS
INTRAVENOUS | Status: DC | PRN
  Administered 2013-08-09: 200 mg via INTRAVENOUS

## 2013-08-09 MED ORDER — GLYCOPYRROLATE 0.2 MG/ML IJ SOLN
INTRAMUSCULAR | Status: DC | PRN
  Administered 2013-08-09: .8 mg via INTRAVENOUS

## 2013-08-09 MED ORDER — OXYCODONE HCL 5 MG PO TABS: 5.0000 mg | ORAL_TABLET | Freq: Once | ORAL | Status: DC | PRN

## 2013-08-09 MED ORDER — NALOXONE HCL 0.4 MG/ML IJ SOLN: 0.4000 mg | INTRAMUSCULAR | Status: DC | PRN

## 2013-08-09 MED ORDER — BUPIVACAINE 0.5 % ON-Q PUMP SINGLE CATH 400 ML
400.0000 mL | INJECTION | Status: DC
  Filled 2013-08-09: qty 400

## 2013-08-09 MED ORDER — ACETAMINOPHEN 325 MG PO TABS: 325.0000 mg | ORAL_TABLET | ORAL | Status: DC | PRN

## 2013-08-09 MED ORDER — LACTATED RINGERS IV SOLN
Freq: Once | INTRAVENOUS | Status: AC
  Administered 2013-08-09: 10:00:00 via INTRAVENOUS

## 2013-08-09 MED ORDER — BISACODYL 5 MG PO TBEC
10.0000 mg | DELAYED_RELEASE_TABLET | Freq: Every day | ORAL | Status: DC
  Administered 2013-08-10 – 2013-08-14 (×5): 10 mg via ORAL
  Filled 2013-08-09 (×5): qty 2

## 2013-08-09 MED ORDER — PANTOPRAZOLE SODIUM 40 MG PO TBEC
40.0000 mg | DELAYED_RELEASE_TABLET | Freq: Every day | ORAL | Status: DC
  Administered 2013-08-10 – 2013-08-14 (×5): 40 mg via ORAL
  Filled 2013-08-09 (×5): qty 1

## 2013-08-09 MED ORDER — ACETAMINOPHEN 160 MG/5ML PO SOLN
325.0000 mg | ORAL | Status: DC | PRN
  Filled 2013-08-09: qty 20.3

## 2013-08-09 MED ORDER — DEXTROSE-NACL 5-0.9 % IV SOLN
INTRAVENOUS | Status: DC
  Administered 2013-08-09: 19:00:00 via INTRAVENOUS
  Administered 2013-08-11: 20 mL/h via INTRAVENOUS

## 2013-08-09 MED ORDER — DIPHENHYDRAMINE HCL 50 MG/ML IJ SOLN
12.5000 mg | Freq: Four times a day (QID) | INTRAMUSCULAR | Status: DC | PRN
  Administered 2013-08-10: 12.5 mg via INTRAVENOUS
  Filled 2013-08-09: qty 1

## 2013-08-09 MED ORDER — DEXTROSE 5 % IV SOLN
1.5000 g | Freq: Two times a day (BID) | INTRAVENOUS | Status: AC
  Administered 2013-08-10 (×2): 1.5 g via INTRAVENOUS
  Filled 2013-08-09 (×2): qty 1.5

## 2013-08-09 MED ORDER — OXYCODONE HCL 5 MG PO TABS: 5.0000 mg | ORAL_TABLET | ORAL | Status: AC | PRN

## 2013-08-09 MED ORDER — OXYCODONE-ACETAMINOPHEN 5-325 MG PO TABS
1.0000 | ORAL_TABLET | ORAL | Status: DC | PRN
  Administered 2013-08-11 – 2013-08-12 (×4): 1 via ORAL
  Administered 2013-08-13: 2 via ORAL
  Administered 2013-08-14 (×2): 1 via ORAL
  Filled 2013-08-09: qty 2
  Filled 2013-08-09: qty 1

## 2013-08-09 MED ORDER — POTASSIUM CHLORIDE 10 MEQ/50ML IV SOLN: 10.0000 meq | Freq: Every day | INTRAVENOUS | Status: DC | PRN

## 2013-08-09 MED ORDER — ROCURONIUM BROMIDE 100 MG/10ML IV SOLN
INTRAVENOUS | Status: DC | PRN
  Administered 2013-08-09 (×6): 10 mg via INTRAVENOUS
  Administered 2013-08-09: 50 mg via INTRAVENOUS

## 2013-08-09 MED ORDER — NEOSTIGMINE METHYLSULFATE 1 MG/ML IJ SOLN
INTRAMUSCULAR | Status: DC | PRN
  Administered 2013-08-09: 4 mg via INTRAVENOUS

## 2013-08-09 SURGICAL SUPPLY — 63 items
BENZOIN TINCTURE PRP APPL 2/3 (GAUZE/BANDAGES/DRESSINGS) ×4 IMPLANT
CANISTER SUCTION 2500CC (MISCELLANEOUS) ×8 IMPLANT
CATH KIT ON Q 5IN SLV (PAIN MANAGEMENT) ×4 IMPLANT
CATH THORACIC 28FR (CATHETERS) ×4 IMPLANT
CLIP TI MEDIUM 6 (CLIP) ×4 IMPLANT
CONN ST 1/4X3/8  BEN (MISCELLANEOUS) ×2
CONN ST 1/4X3/8 BEN (MISCELLANEOUS) ×2 IMPLANT
CONN Y 3/8X3/8X3/8  BEN (MISCELLANEOUS) ×2
CONN Y 3/8X3/8X3/8 BEN (MISCELLANEOUS) ×2 IMPLANT
CONT SPEC 4OZ CLIKSEAL STRL BL (MISCELLANEOUS) ×8 IMPLANT
COVER SURGICAL LIGHT HANDLE (MISCELLANEOUS) ×4 IMPLANT
DERMABOND ADVANCED (GAUZE/BANDAGES/DRESSINGS) ×2
DERMABOND ADVANCED .7 DNX12 (GAUZE/BANDAGES/DRESSINGS) ×2 IMPLANT
DRAIN CHANNEL 32F RND 10.7 FF (WOUND CARE) ×4 IMPLANT
DRAPE LAPAROSCOPIC ABDOMINAL (DRAPES) ×4 IMPLANT
DRAPE WARM FLUID 44X44 (DRAPE) ×4 IMPLANT
ELECT REM PT RETURN 9FT ADLT (ELECTROSURGICAL) ×4
ELECTRODE REM PT RTRN 9FT ADLT (ELECTROSURGICAL) ×2 IMPLANT
GLOVE BIO SURGEON STRL SZ 6.5 (GLOVE) ×3 IMPLANT
GLOVE BIO SURGEONS STRL SZ 6.5 (GLOVE) ×1
GLOVE BIOGEL PI IND STRL 6.5 (GLOVE) ×6 IMPLANT
GLOVE BIOGEL PI INDICATOR 6.5 (GLOVE) ×6
GLOVE ECLIPSE 6.5 STRL STRAW (GLOVE) ×8 IMPLANT
GLOVE SURG SIGNA 7.5 PF LTX (GLOVE) ×8 IMPLANT
GOWN PREVENTION PLUS XLARGE (GOWN DISPOSABLE) ×8 IMPLANT
GOWN STRL NON-REIN LRG LVL3 (GOWN DISPOSABLE) ×8 IMPLANT
HANDLE STAPLE ENDO GIA SHORT (STAPLE) ×2
HEMOSTAT SURGICEL 2X14 (HEMOSTASIS) ×4 IMPLANT
KIT BASIN OR (CUSTOM PROCEDURE TRAY) ×4 IMPLANT
KIT ROOM TURNOVER OR (KITS) ×4 IMPLANT
KIT SUCTION CATH 14FR (SUCTIONS) ×4 IMPLANT
NS IRRIG 1000ML POUR BTL (IV SOLUTION) ×12 IMPLANT
PACK CHEST (CUSTOM PROCEDURE TRAY) ×4 IMPLANT
PAD ARMBOARD 7.5X6 YLW CONV (MISCELLANEOUS) ×8 IMPLANT
POUCH ENDO CATCH II 15MM (MISCELLANEOUS) ×4 IMPLANT
POUCH SPECIMEN RETRIEVAL 10MM (ENDOMECHANICALS) ×4 IMPLANT
RELOAD EGIA 45 MED/THCK PURPLE (STAPLE) ×28 IMPLANT
RELOAD EGIA 45 TAN VASC (STAPLE) ×4 IMPLANT
RELOAD EGIA 60 MED/THCK PURPLE (STAPLE) ×32 IMPLANT
RELOAD EGIA TRIS TAN 45 CVD (STAPLE) ×8 IMPLANT
SEALANT PROGEL (MISCELLANEOUS) ×4 IMPLANT
SOLUTION ANTI FOG 6CC (MISCELLANEOUS) ×4 IMPLANT
SPECIMEN JAR MEDIUM (MISCELLANEOUS) ×32 IMPLANT
SPONGE GAUZE 4X4 12PLY (GAUZE/BANDAGES/DRESSINGS) ×4 IMPLANT
SPONGE INTESTINAL PEANUT (DISPOSABLE) ×8 IMPLANT
STAPLER ENDO GIA 12MM SHORT (STAPLE) ×2 IMPLANT
SUT SILK  1 MH (SUTURE) ×2
SUT SILK 1 MH (SUTURE) ×2 IMPLANT
SUT SILK 2 0 SH (SUTURE) ×8 IMPLANT
SUT VIC AB 1 CTX 27 (SUTURE) ×4 IMPLANT
SUT VIC AB 2-0 CT1 27 (SUTURE) ×2
SUT VIC AB 2-0 CT1 TAPERPNT 27 (SUTURE) ×2 IMPLANT
SUT VIC AB 2-0 CTX 36 (SUTURE) ×4 IMPLANT
SUT VIC AB 3-0 X1 27 (SUTURE) ×8 IMPLANT
SUT VICRYL 0 UR6 27IN ABS (SUTURE) ×8 IMPLANT
SYSTEM SAHARA CHEST DRAIN ATS (WOUND CARE) ×4 IMPLANT
TAPE CLOTH SURG 4X10 WHT LF (GAUZE/BANDAGES/DRESSINGS) ×4 IMPLANT
TIP APPLICATOR SPRAY EXTEND 16 (VASCULAR PRODUCTS) ×4 IMPLANT
TOWEL OR 17X24 6PK STRL BLUE (TOWEL DISPOSABLE) ×4 IMPLANT
TOWEL OR 17X26 10 PK STRL BLUE (TOWEL DISPOSABLE) ×8 IMPLANT
TRAY FOLEY CATH 16FRSI W/METER (SET/KITS/TRAYS/PACK) ×4 IMPLANT
TUNNELER SHEATH ON-Q 11GX8 DSP (PAIN MANAGEMENT) ×4 IMPLANT
WATER STERILE IRR 1000ML POUR (IV SOLUTION) ×8 IMPLANT

## 2013-08-09 NOTE — Anesthesia Preprocedure Evaluation (Signed)
Anesthesia Evaluation  Patient identified by MRN, date of birth, ID band Patient awake    Reviewed: Allergy & Precautions, H&P , NPO status , Patient's Chart, lab work & pertinent test results  History of Anesthesia Complications Negative for: history of anesthetic complications  Airway Mallampati: II TM Distance: >3 FB Neck ROM: Full    Dental  (+) Upper Dentures and Missing   Pulmonary Current Smoker,  Left lung lesion on imaging   Pulmonary exam normal       Cardiovascular Exercise Tolerance: Good METS: 5 - 7 Mets - angina- DOE - Valvular Problems/MurmursRhythm:Regular Rate:Normal     Neuro/Psych negative neurological ROS  negative psych ROS   GI/Hepatic negative GI ROS, Neg liver ROS,   Endo/Other  Lupus cutaneous manifestations   Renal/GU negative Renal ROS  negative genitourinary   Musculoskeletal negative musculoskeletal ROS (+)   Abdominal   Peds  Hematology negative hematology ROS (+)   Anesthesia Other Findings   Reproductive/Obstetrics                           Anesthesia Physical Anesthesia Plan  ASA: II  Anesthesia Plan: General   Post-op Pain Management:    Induction: Intravenous  Airway Management Planned: Double Lumen EBT  Additional Equipment: Arterial line  Intra-op Plan:   Post-operative Plan: Extubation in OR  Informed Consent: I have reviewed the patients History and Physical, chart, labs and discussed the procedure including the risks, benefits and alternatives for the proposed anesthesia with the patient or authorized representative who has indicated his/her understanding and acceptance.   Dental advisory given  Plan Discussed with: CRNA and Surgeon  Anesthesia Plan Comments:         Anesthesia Quick Evaluation
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
## 2013-08-09 NOTE — Transfer of Care (Signed)
Immediate Anesthesia Transfer of Care Note  Patient: Aaron Conway  Procedure(s) Performed: Procedure(s): VIDEO ASSISTED THORACOSCOPY (VATS)/WEDGE RESECTION AND LEFT LOWER LOBECTOMY (Left)  Patient Location: PACU  Anesthesia Type:General  Level of Consciousness: awake, alert  and oriented  Airway & Oxygen Therapy: Patient Spontanous Breathing and Patient connected to face mask oxygen  Post-op Assessment: Report given to PACU RN, Post -op Vital signs reviewed and stable and Patient moving all extremities X 4  Post vital signs: Reviewed and stable  Complications: No apparent anesthesia complications

## 2013-08-09 NOTE — Progress Notes (Signed)
CT surgery p.m. Rounds  Patient resting comfortably after left lower lobectomy Oxygen saturation 95% Minimal chest tube output no airleak Maintaining sinus rhythm Doing well

## 2013-08-09 NOTE — Anesthesia Postprocedure Evaluation (Signed)
Anesthesia Post Note  Patient: Aaron Conway  Procedure(s) Performed: Procedure(s) (LRB): VIDEO ASSISTED THORACOSCOPY (VATS)/WEDGE RESECTION AND LEFT LOWER LOBECTOMY (Left)  Anesthesia type: General  Patient location: PACU  Post pain: Pain level controlled and Adequate analgesia  Post assessment: Post-op Vital signs reviewed, Patient's Cardiovascular Status Stable, Respiratory Function Stable, Patent Airway and Pain level controlled  Last Vitals:  Filed Vitals:   08/09/13 1540  BP:   Pulse: 80  Temp: 36.5 C  Resp: 20    Post vital signs: Reviewed and stable  Level of consciousness: awake, alert  and oriented  Complications: No apparent anesthesia complications

## 2013-08-09 NOTE — Brief Op Note (Addendum)
08/09/2013  3:25 PM  PATIENT:  Aaron Conway  63 y.o. male  PRE-OPERATIVE DIAGNOSIS:  LLL LUNG NODULE  POST-OPERATIVE DIAGNOSIS:  Non-small cell cancer left lower lobe  PROCEDURE:   LEFT VIDEO ASSISTED THORACOSCOPY (VATS) WEDGE RESECTION THORACOSCOPIC LEFT LOWER LOBE BASILAR SEGMENTECTOMY WITH LYMPH NODE DISSECTION ON Q LOCAL ANESTHETIC CATHETER PLACEMENT  SURGEON:  Surgeon(s) and Role:    * Melrose Nakayama, MD - Primary  PHYSICIAN ASSISTANT: Lars Pinks PA-C  ANESTHESIA:   general  EBL:  Total I/O In: 2300 [I.V.:2300] Out: 29 [Urine:695; Blood:300]  BLOOD ADMINISTERED:none  DRAINS: 96 French chest tube and 32 Blake drain in the left pleural space   SPECIMEN:  Source of Specimen:  Wedge LLL, LLL, lymph nodes  DISPOSITION OF SPECIMEN:  PATHOLOGY  COUNTS CORRECT:  YES  PLAN OF CARE: Admit to inpatient   PATIENT DISPOSITION:  PACU - hemodynamically stable.   Delay start of Pharmacological VTE agent (>24hrs) due to surgical blood loss or risk of bleeding: yes  FINDINGS: 2 cm mass LLL with contraction of the visceral pleura. FROZEN + non-small cell CA

## 2013-08-09 NOTE — Interval H&P Note (Signed)
History and Physical Interval Note:  08/09/2013 9:44 AM  Aaron Conway  has presented today for surgery, with the diagnosis of LLL LUNG NODULE  The various methods of treatment have been discussed with the patient and family. After consideration of risks, benefits and other options for treatment, the patient has consented to  Procedure(s): VIDEO ASSISTED THORACOSCOPY (VATS)/WEDGE RESECTION (Left) SEGMENTECTOMY (Left) as a surgical intervention .  The patient's history has been reviewed, patient examined, no change in status, stable for surgery.  I have reviewed the patient's chart and labs.  Questions were answered to the patient's satisfaction.    CHEST 2 VIEW  COMPARISON: 07/05/2013 ; correlation PET-CT 07/16/2013  FINDINGS:  Normal heart size, mediastinal contours, and pulmonary vascularity.  Subsegmental atelectasis at right base, chronic.  Underlying emphysematous and minimal bronchitic changes.  Area of opacity at the lateral left lung base has slightly decreased  in size 2.2 x 2.0 cm, previously 2.9 x 2.7 cm.  Questionable vague nodular density in the lateral right mid lung 6  mm diameter, not seen on prior radiograph but corresponding to an 8  mm diameter right upper lobe nodule on prior PET-CT.  Probable bilateral nipple shadows.  No acute infiltrate, pleural effusion or pneumothorax.  Bones demineralized with thoracolumbar scoliosis.  IMPRESSION:  COPD changes with chronic atelectasis at right base.  Interval decrease in size of nodular density at the lateral left  lung base.  Stable right upper lobe nodule.  No new abnormalities.  Electronically Signed  By: Lavonia Dana M.D.  On: 08/06/2013 16:20   PFTs OK  Preop CXR showed the mass decreased in size relative to the last CXR. Mass is roughly the same size as it was on the PET(1.4 X 1.9 on PET, 2 cm on CXR). Although observation would be an option, I think we are better off proceeding with the wedge resection for  definitive diagnosis. Discussed with Aaron Conway and his son. He wishes to proceed.  Zita Ozimek C

## 2013-08-10 ENCOUNTER — Encounter: Payer: Medicaid Other | Admitting: Thoracic Surgery (Cardiothoracic Vascular Surgery)

## 2013-08-10 ENCOUNTER — Inpatient Hospital Stay (HOSPITAL_COMMUNITY): Payer: Medicaid Other

## 2013-08-10 LAB — CBC
HEMATOCRIT: 36.2 % — AB (ref 39.0–52.0)
HEMOGLOBIN: 12.9 g/dL — AB (ref 13.0–17.0)
MCH: 33.2 pg (ref 26.0–34.0)
MCHC: 35.6 g/dL (ref 30.0–36.0)
MCV: 93.3 fL (ref 78.0–100.0)
Platelets: 114 10*3/uL — ABNORMAL LOW (ref 150–400)
RBC: 3.88 MIL/uL — AB (ref 4.22–5.81)
RDW: 13.9 % (ref 11.5–15.5)
WBC: 7.2 10*3/uL (ref 4.0–10.5)

## 2013-08-10 LAB — BASIC METABOLIC PANEL
BUN: 6 mg/dL (ref 6–23)
CHLORIDE: 100 meq/L (ref 96–112)
CO2: 24 meq/L (ref 19–32)
Calcium: 8.5 mg/dL (ref 8.4–10.5)
Creatinine, Ser: 0.53 mg/dL (ref 0.50–1.35)
GFR calc Af Amer: >90 mL/min (ref >90)
GFR calc non Af Amer: >90 mL/min (ref >90)
GLUCOSE: 126 mg/dL — AB (ref 70–99)
Potassium: 3.8 mEq/L (ref 3.7–5.3)
Sodium: 136 mEq/L — ABNORMAL LOW (ref 137–147)

## 2013-08-10 LAB — POCT I-STAT 3, ART BLOOD GAS (G3+)
Acid-Base Excess: 2 mmol/L (ref 0.0–2.0)
BICARBONATE: 28.1 meq/L — AB (ref 20.0–24.0)
O2 Saturation: 91 %
PH ART: 7.387 (ref 7.350–7.450)
Patient temperature: 97.6
TCO2: 30 mmol/L (ref 0–100)
pCO2 arterial: 46.5 mmHg — ABNORMAL HIGH (ref 35.0–45.0)
pO2, Arterial: 61 mmHg — ABNORMAL LOW (ref 80.0–100.0)

## 2013-08-10 MED ORDER — POTASSIUM CHLORIDE CRYS ER 20 MEQ PO TBCR
20.0000 meq | EXTENDED_RELEASE_TABLET | Freq: Once | ORAL | Status: AC
  Administered 2013-08-10: 20 meq via ORAL
  Filled 2013-08-10: qty 1

## 2013-08-10 MED ORDER — ENOXAPARIN SODIUM 40 MG/0.4ML ~~LOC~~ SOLN
40.0000 mg | SUBCUTANEOUS | Status: DC
  Administered 2013-08-10 – 2013-08-14 (×5): 40 mg via SUBCUTANEOUS
  Filled 2013-08-10 (×6): qty 0.4

## 2013-08-10 NOTE — Progress Notes (Addendum)
[facility] DAILY ICU PROGRESS NOTE                   [address]            [place] [address]          [phone]   1 Day Post-Op Procedure(s) (LRB): VIDEO ASSISTED THORACOSCOPY (VATS)/WEDGE RESECTION AND LEFT LOWER LOBECTOMY (Left)  Total Length of Stay:  LOS: 1 day   Subjective: Patient sitting in chair without specific complaints. He states his pain is well controlled with PCA.  Objective: Vital signs in last 24 hours: Temp:  [97.5 F (36.4 C)-98.3 F (36.8 C)] 97.5 F (36.4 C) (01/20 0400) Pulse Rate:  [54-80] 58 (01/20 0700) Cardiac Rhythm:  [-] Normal sinus rhythm (01/19 1615) Resp:  [7-22] 17 (01/20 0700) BP: (115-180)/(64-88) 143/77 mmHg (01/20 0700) SpO2:  [94 %-100 %] 97 % (01/20 0700) Arterial Line BP: (123-163)/(54-74) 157/66 mmHg (01/20 0700) Weight:  [69.854 kg (154 lb)] 69.854 kg (154 lb) (01/19 1431)  Filed Weights   08/09/13 1431  Weight: 69.854 kg (154 lb)      Intake/Output from previous day: 01/19 0701 - 01/20 0700 In: 3570 [I.V.:3520; IV Piggyback:50] Out: 3762 [Urine:1610; Blood:300; Chest Tube:600]     Current Meds: Scheduled Meds: . acetaminophen  1,000 mg Oral Q6H   Or  . acetaminophen (TYLENOL) oral liquid 160 mg/5 mL  1,000 mg Oral Q6H  . bisacodyl  10 mg Oral Daily  . cefUROXime (ZINACEF)  IV  1.5 g Intravenous Q12H  . fentaNYL   Intravenous Q4H  . mupirocin ointment   Nasal BID  . pantoprazole  40 mg Oral Daily   Continuous Infusions: . bupivacaine ON-Q pain pump    . dextrose 5 % and 0.9% NaCl 100 mL/hr at 08/09/13 2000   PRN Meds:.diphenhydrAMINE, diphenhydrAMINE, naloxone, ondansetron (ZOFRAN) IV, oxyCODONE, oxyCODONE-acetaminophen, oxyCODONE-acetaminophen, potassium chloride, senna-docusate, sodium chloride  General appearance: alert, cooperative and no distress Heart: regular rate and rhythm Lungs: Slightly diminished left base;right lung is clear;no rales, wheezes, or rhonchi Abdomen: Semi soft, NT,  sporadic bowel sounds Extremities: No cyanosis or edema Wound: Dressing is clean and dry  Lab Results: CBC: Recent Labs  08/10/13 0412  WBC 7.2  HGB 12.9*  HCT 36.2*  PLT 114*   BMET:  Recent Labs  08/10/13 0412  NA 136*  K 3.8  CL 100  CO2 24  GLUCOSE 126*  BUN 6  CREATININE 0.53  CALCIUM 8.5    PT/INR: No results found for this basename: LABPROT, INR,  in the last 72 hours Radiology: Dg Chest Portable 1 View  08/09/2013   CLINICAL DATA:  Postop surgery.  Mass in lung.  EXAM: PORTABLE CHEST - 1 VIEW  COMPARISON:  08/06/2013  FINDINGS: Heart size is mildly enlarged. There is pulmonary vascular congestion. Atelectasis is identified in the lung bases are identified. There are two left-sided chest tubes in place. No significant pneumothorax identified.  IMPRESSION: 1. Postoperative changes involving the left hemi thorax. 2. Left chest tubes in place without significant pneumothorax. 3. Bibasilar atelectasis and pulmonary vascular congestion.   Electronically Signed   By: Kerby Moors M.D.   On: 08/09/2013 16:39     Assessment/Plan: S/P Procedure(s) (LRB): VIDEO ASSISTED THORACOSCOPY (VATS)/WEDGE RESECTION AND LEFT LOWER LOBECTOMY (Left)  1.CV-SR 2.Pulmonary-Chest tubes with 600 cc since surgery. There was a +1 intermittent air leak with cough. Chest tubes to remain to suction for now. CXR shows no pneumothorax, bibasilar atelectasis,  and some pulmonary congestion. Final pathology pending. Encourage incentive spirometer. 3. Mild ABL anemia-H and H 12.9 and 36.2 4.Supplement potassium 5.Remove a line 6.Decrease IVF as tolerates diet  ZIMMERMAN,DONIELLE M PA-C 08/10/2013 7:27 AM  Patient seen and examined, agree with above Minimal air leak- CT to water seal OOB Add enoxaparin to SCD for DVT prophylaxis

## 2013-08-10 NOTE — Op Note (Signed)
NAME:  Aaron Conway, Aaron Conway NO.:  [id number]  MEDICAL RECORD NO.:  [id number]  LOCATION:  2S08C                        FACILITY:  [facility]  PHYSICIAN:  Revonda Standard. Roxan Hockey, M.D.DATE OF BIRTH:  [birth year]  DATE OF PROCEDURE:  08/09/2013 DATE OF DISCHARGE:                              OPERATIVE REPORT   PREOPERATIVE DIAGNOSIS:  Left lower lobe mass.  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  PROCEDURE:  Left video-assisted thoracoscopy, wedge resection left lower lobe nodule, thoracoscopic left lower lobe basilar segmentectomy, mediastinal lymph node dissection, On-Q local anesthetic catheter placement.  SURGEON:  Revonda Standard. Roxan Hockey, M.D.  ASSISTANT:  Lars Pinks, PA-C  ANESTHESIA:  General.  FINDINGS:  Approximately 2 cm mass in the lateral aspect of the left lower lobe with contraction of the overlying visceral pleura.  Frozen section revealed non-small-cell carcinoma.  Bronchial margin free of disease.  CLINICAL NOTE:  [name] is a 63 year old gentleman with a history of tobacco abuse, who had a small-bowel obstruction back in October.  A CT of the abdomen and pelvis was done and there was a small cavitary lesion at the base of the left lower lobe.  A CT of the chest showed this lesion as well as a 6 mm nodule in the right upper lobe.  A PET-CT was done, which showed mild hypermetabolic uptake.  He was referred for surgical consideration.  The indications, risks, benefits, and alternatives were discussed in detail with the patient.  He accepted the risks and agreed to proceed with surgical resection.  We planned to do a wedge resection for diagnostic purposes to be followed by a basilar segmentectomy or lobectomy depending on anatomic factors if the lesion were positive for cancer.  The patient's preoperative chest x-ray, the mass appeared smaller than the previous chest x-ray.  However, when compared to the PET  scan, the lesion appeared to be roughly the same size that it had been on the PET.  I recommended to the patient that we proceed with surgical resection given that I was still highly suspicious this was cancer.  He did wish to proceed with surgery.  OPERATIVE NOTE:  [name] was brought to the preoperative holding room on August 09, 2013.  Anesthesia placed a arterial blood pressure monitoring line and established intravenous access.  After discussion of the chest x-ray results, the patient was taken to the operating room. Intravenous antibiotics were administered.  He was anesthetized and intubated with a double-lumen endotracheal tube.  Sequential compression devices were placed on the legs for DVT prophylaxis.  A Foley catheter was placed.  He was placed in a right lateral decubitus position and the left chest was prepped and draped in the usual sterile fashion.  Single lung ventilation of the right lung was initiated and was tolerated well throughout the procedure.  A small port incision was made in the left lateral chest in the midaxillary line in approximately the seventh intercostal space.  A port was inserted and the thoracoscope was placed into the chest.  There was good isolation of the left lung.  A small utility incision approximately 5 cm in length was made 2 interspaces above anterolaterally.  No rib spreading was performed during the procedure.  Inspection of the lower lobe revealed a mass in the anterolateral aspect of the lobe along the fissure.  There was contraction of the overlying visceral pleura.  A wedge resection was performed with sequential firings of an endoscopic GIA stapler.  The specimen was placed into an endoscopic retrieval bag, removed, and sent for frozen section.  Frozen section revealed non-small- cell carcinoma.  There was tumor at the margin, but this was inconsequential since we planned to proceed with a segmentectomy or lobectomy if the lesion  were cancerous.  The inferior pulmonary ligament was divided.  The inferior ligament node was left attached to the lower lobe and not sent as a separate specimen.  The inferior pulmonary vein was dissected out.  The superior segmental branch was identified and preserved.  Remainder of the inferior pulmonary vein was divided with an endoscopic vascular stapler.  The fissure was incomplete anteriorly. This dissection was slow and tedious.  The pulmonary artery was visible more proximally in the fissure between the upper lobe and the superior segment.  It was dissected out in this area.  The superior segmental branch was identified.  The node at the bifurcation of the superior segmental branch was taken as a separate specimen.  The major fissure was completed with sequential firings of an endoscopic GIA stapler.  The basilar segmental branches of the pulmonary artery were identified and were dissected out.  These were encircled and then divided with an endoscopic vascular stapler.  This exposed the left lower lobe bronchus.  The bronchus was relatively difficult to dissect out and this was particularly difficult to tell where the superior segmental bronchus arose.  However, ultimately, this was identified. The bronchus was encircled.  A stapler was placed across the bronchus. A test inflation showed good aeration of the upper lobe and superior segment.  The stapler was fired dividing the bronchus.  The segmentectomy was completed with division of the parenchyma with sequential firings of an endoscopic GIA stapler.  The specimen was placed into a large endoscopic retrieval bag and removed through the utility incision which had to be lengthened slightly to allow the specimen to be removed.  Again, no rib spreading was necessary.  Inspection of the bronchial stump revealed that it had been divided tangentially leaving potential blind pouch.  The basilar segmental bronchus was restapled, flush  with the origin of the superior segmental bronchus.  This specimen was sent initially for frozen section, but the pathologist felt there were too many staples to perform a frozen and, therefore, a frozen was done on the basilar segmental bronchial margin which revealed no tumor at the margin.  Level 10 nodes were identified and taken and sent separately.  Additional level 11 nodes were taken as well.  The subcarinal space was exposed and a large otherwise benign-appearing node was identified and resected.  There were some adhesions of the upper lobe which made it very difficult to visualize the aortopulmonary window, so no dissection was performed in that area.  The chest was copiously irrigated with warm saline. Reinflation of the lung revealed some air leaks from the upper lobe along the area where the fissure was dissected.  ProGEL was applied to these areas and inflation was held for 2 minutes while the ProGEL set. A stab incision was made posteriorly and an On-Q local anesthetic catheter was placed in a subpleural location.  It was secured to the skin with a 3-0 silk suture.  After the ProGEL had been allowed to set, the left lung was again reinflated.  A 32-French Blake drain was placed posteriorly and a 28-French chest tube was placed anteriorly.  The utility incision was closed with #1 Vicryl fascial suture, followed by 2- 0 Vicryl subcutaneous suture and a 3-0 Vicryl subcuticular suture.  All sponge, needle, and instrument counts were correct at the end of the procedure.  The patient was taken from the operating room to the postanesthetic care unit in good condition.     Revonda Standard Roxan Hockey, M.D.     SCH/MEDQ  D:  08/09/2013  T:  08/10/2013  Job:  [id number]

## 2013-08-10 NOTE — Progress Notes (Signed)
Patient ID: Aaron Conway, male   DOB: Feb 07, 1951, 63 y.o.   MRN: [id number] EVENING ROUNDS NOTE :     [name]       [place] [address]             [phone]                 1 Day Post-Op Procedure(s) (LRB): VIDEO ASSISTED THORACOSCOPY (VATS)/WEDGE RESECTION AND LEFT LOWER LOBECTOMY (Left)  Total Length of Stay:  LOS: 1 day  BP 147/76  Pulse 78  Temp(Src) 98.3 F (36.8 C) (Oral)  Resp 19  Ht 5\' 5"  (1.651 m)  Wt 153 lb 14.1 oz (69.8 kg)  BMI 25.61 kg/m2  SpO2 94%  .Intake/Output     01/19 0701 - 01/20 0700 01/20 0701 - 01/21 0700   P.O.  840   I.V. (mL/kg) 3520 (50.4) 288 (4.1)   IV Piggyback 50    Total Intake(mL/kg) 3570 (51.1) 1128 (16.2)   Urine (mL/kg/hr) 1910 935 (1.3)   Blood 300    Chest Tube 600 150 (0.2)   Total Output 2810 1085   Net +760 +43          . bupivacaine ON-Q pain pump    . dextrose 5 % and 0.9% NaCl 20 mL/hr at 08/10/13 1500     Lab Results  Component Value Date   WBC 7.2 08/10/2013   HGB 12.9* 08/10/2013   HCT 36.2* 08/10/2013   PLT 114* 08/10/2013   GLUCOSE 126* 08/10/2013   ALT 15 08/06/2013   AST 26 08/06/2013   NA 136* 08/10/2013   K 3.8 08/10/2013   CL 100 08/10/2013   CREATININE 0.53 08/10/2013   BUN 6 08/10/2013   CO2 24 08/10/2013   INR 0.95 08/06/2013   Stable day Foley still in   Grace Isaac MD  [place] Office [phone] 08/10/2013 5:42 PM

## 2013-08-10 NOTE — Progress Notes (Signed)
UR Completed.  Aaron Conway [phone] 08/10/2013

## 2013-08-10 NOTE — Progress Notes (Signed)
Patient ID: Aaron Conway, male   DOB: Apr 15, 1951, 62 y.o.   MRN: [id number] EVENING ROUNDS NOTE :     [address]       [place] [address]             [phone]                 1 Day Post-Op Procedure(s) (LRB): VIDEO ASSISTED THORACOSCOPY (VATS)/WEDGE RESECTION AND LEFT LOWER LOBECTOMY (Left)  Total Length of Stay:  LOS: 1 day  BP 147/76  Pulse 78  Temp(Src) 98.3 F (36.8 C) (Oral)  Resp 19  Ht 5\' 5"  (1.651 m)  Wt 153 lb 14.1 oz (69.8 kg)  BMI 25.61 kg/m2  SpO2 94%  .Intake/Output     01/19 0701 - 01/20 0700 01/20 0701 - 01/21 0700   P.O.  840   I.V. (mL/kg) 3520 (50.4) 288 (4.1)   IV Piggyback 50    Total Intake(mL/kg) 3570 (51.1) 1128 (16.2)   Urine (mL/kg/hr) 1910 935 (1.3)   Blood 300    Chest Tube 600 150 (0.2)   Total Output 2810 1085   Net +760 +43          . bupivacaine ON-Q pain pump    . dextrose 5 % and 0.9% NaCl 20 mL/hr at 08/10/13 1500     Lab Results  Component Value Date   WBC 7.2 08/10/2013   HGB 12.9* 08/10/2013   HCT 36.2* 08/10/2013   PLT 114* 08/10/2013   GLUCOSE 126* 08/10/2013   ALT 15 08/06/2013   AST 26 08/06/2013   NA 136* 08/10/2013   K 3.8 08/10/2013   CL 100 08/10/2013   CREATININE 0.53 08/10/2013   BUN 6 08/10/2013   CO2 24 08/10/2013   INR 0.95 08/06/2013   Waiting for stepdown bed  Grace Isaac MD  [facility] [phone] Office [phone] 08/10/2013 5:39 PM

## 2013-08-11 ENCOUNTER — Inpatient Hospital Stay (HOSPITAL_COMMUNITY): Payer: Medicaid Other

## 2013-08-11 LAB — CBC
HEMATOCRIT: 36.6 % — AB (ref 39.0–52.0)
HEMOGLOBIN: 12.8 g/dL — AB (ref 13.0–17.0)
MCH: 32.7 pg (ref 26.0–34.0)
MCHC: 35 g/dL (ref 30.0–36.0)
MCV: 93.4 fL (ref 78.0–100.0)
Platelets: 114 10*3/uL — ABNORMAL LOW (ref 150–400)
RBC: 3.92 MIL/uL — ABNORMAL LOW (ref 4.22–5.81)
RDW: 13.8 % (ref 11.5–15.5)
WBC: 6.9 10*3/uL (ref 4.0–10.5)

## 2013-08-11 LAB — COMPREHENSIVE METABOLIC PANEL
ALBUMIN: 3 g/dL — AB (ref 3.5–5.2)
ALT: 12 U/L (ref 0–53)
AST: 25 U/L (ref 0–37)
Alkaline Phosphatase: 37 U/L — ABNORMAL LOW (ref 39–117)
BUN: 6 mg/dL (ref 6–23)
CHLORIDE: 98 meq/L (ref 96–112)
CO2: 25 mEq/L (ref 19–32)
CREATININE: 0.56 mg/dL (ref 0.50–1.35)
Calcium: 8.3 mg/dL — ABNORMAL LOW (ref 8.4–10.5)
GFR calc non Af Amer: >90 mL/min (ref >90)
GLUCOSE: 114 mg/dL — AB (ref 70–99)
Potassium: 3.6 mEq/L — ABNORMAL LOW (ref 3.7–5.3)
Sodium: 135 mEq/L — ABNORMAL LOW (ref 137–147)
Total Bilirubin: 0.3 mg/dL (ref 0.3–1.2)
Total Protein: 6.8 g/dL (ref 6.0–8.3)

## 2013-08-11 LAB — GLUCOSE, CAPILLARY
GLUCOSE-CAPILLARY: 101 mg/dL — AB (ref 70–99)
Glucose-Capillary: 122 mg/dL — ABNORMAL HIGH (ref 70–99)

## 2013-08-11 MED ORDER — POLYETHYLENE GLYCOL 3350 17 G PO PACK
17.0000 g | PACK | Freq: Once | ORAL | Status: AC
  Administered 2013-08-12: 17 g via ORAL
  Filled 2013-08-11 (×2): qty 1

## 2013-08-11 MED ORDER — METOPROLOL TARTRATE 12.5 MG HALF TABLET
12.5000 mg | ORAL_TABLET | Freq: Two times a day (BID) | ORAL | Status: DC
  Administered 2013-08-11 – 2013-08-14 (×8): 12.5 mg via ORAL
  Filled 2013-08-11 (×9): qty 1

## 2013-08-11 MED ORDER — METOPROLOL TARTRATE 12.5 MG HALF TABLET
12.5000 mg | ORAL_TABLET | Freq: Two times a day (BID) | ORAL | Status: DC
  Filled 2013-08-11: qty 1

## 2013-08-11 MED ORDER — POTASSIUM CHLORIDE CRYS ER 20 MEQ PO TBCR
20.0000 meq | EXTENDED_RELEASE_TABLET | ORAL | Status: DC | PRN
  Administered 2013-08-11 (×2): 20 meq via ORAL
  Filled 2013-08-11 (×2): qty 1

## 2013-08-11 MED ORDER — AMLODIPINE BESYLATE 5 MG PO TABS
5.0000 mg | ORAL_TABLET | Freq: Every day | ORAL | Status: DC
  Administered 2013-08-11 – 2013-08-14 (×4): 5 mg via ORAL
  Filled 2013-08-11 (×4): qty 1

## 2013-08-11 NOTE — Progress Notes (Signed)
2 Days Post-Op Procedure(s) (LRB): VIDEO ASSISTED THORACOSCOPY (VATS)/WEDGE RESECTION AND LEFT LOWER LOBECTOMY (Left) Subjective: Had more pain last night, better this AM No other complaints  Objective: Vital signs in last 24 hours: Temp:  [97.2 F (36.2 C)-99 F (37.2 C)] 98.8 F (37.1 C) (01/21 0750) Pulse Rate:  [62-79] 69 (01/21 0600) Cardiac Rhythm:  [-] Normal sinus rhythm (01/21 0600) Resp:  [10-23] 13 (01/21 0600) BP: (113-157)/(55-101) 149/83 mmHg (01/21 0600) SpO2:  [91 %-98 %] 94 % (01/21 0600)  Hemodynamic parameters for last 24 hours:    Intake/Output from previous day: 01/20 0701 - 01/21 0700 In: 1750 [P.O.:1080; I.V.:670] Out: 2615 [Urine:2265; Chest Tube:350] Intake/Output this shift:    General appearance: alert and no distress Neurologic: intact Heart: regular rate and rhythm Lungs: diminished breath sounds left base Abdomen: normal findings: soft, non-tender no air leak, seroanguinous drainage  Lab Results:  Recent Labs  08/10/13 0412 08/11/13 0332  WBC 7.2 6.9  HGB 12.9* 12.8*  HCT 36.2* 36.6*  PLT 114* 114*   BMET:  Recent Labs  08/10/13 0412 08/11/13 0332  NA 136* 135*  K 3.8 3.6*  CL 100 98  CO2 24 25  GLUCOSE 126* 114*  BUN 6 6  CREATININE 0.53 0.56  CALCIUM 8.5 8.3*    PT/INR: No results found for this basename: LABPROT, INR,  in the last 72 hours ABG    Component Value Date/Time   PHART 7.387 08/10/2013 0448   HCO3 28.1* 08/10/2013 0448   TCO2 30 08/10/2013 0448   O2SAT 91.0 08/10/2013 0448   CBG (last 3)   Recent Labs  08/11/13 0753  GLUCAP 122*    Assessment/Plan: S/P Procedure(s) (LRB): VIDEO ASSISTED THORACOSCOPY (VATS)/WEDGE RESECTION AND LEFT LOWER LOBECTOMY (Left) Plan for transfer to step-down: see transfer orders Awaiting bed on 3 south  No air leak- dc anterior CT Keep posterior tube on water seal Ambulated well yesterday- continue SCD + enoxaparin for DVT prophylaxis Thrombocytopenia stable on  enoxaparin Hypokalemia- supplement Hypertension- BP still elevated on low dose lopressor- will add norvasc   LOS: 2 days    HENDRICKSON,STEVEN C 08/11/2013

## 2013-08-11 NOTE — Progress Notes (Signed)
[facility] BRIEF SICU PROGRESS NOTE  2 Days Post-Op  S/P Procedure(s) (LRB): VIDEO ASSISTED THORACOSCOPY (VATS)/WEDGE RESECTION AND LEFT LOWER LOBECTOMY (Left)   Stable day Still waiting for room on step down unit for transfer  Plan: Continue current plan  Charlie Char H 08/11/2013 7:04 PM

## 2013-08-11 NOTE — Progress Notes (Signed)
SBP sustaining 150s. MD aware. Received orders for 12.5 mg Lopressor q12hr. Will continue to monitor pt.

## 2013-08-12 ENCOUNTER — Inpatient Hospital Stay (HOSPITAL_COMMUNITY): Payer: Medicaid Other

## 2013-08-12 ENCOUNTER — Encounter (HOSPITAL_COMMUNITY): Payer: Self-pay | Admitting: Thoracic Surgery (Cardiothoracic Vascular Surgery)

## 2013-08-12 LAB — CBC
HEMATOCRIT: 37.1 % — AB (ref 39.0–52.0)
Hemoglobin: 13.2 g/dL (ref 13.0–17.0)
MCH: 33.2 pg (ref 26.0–34.0)
MCHC: 35.6 g/dL (ref 30.0–36.0)
MCV: 93.2 fL (ref 78.0–100.0)
Platelets: 125 10*3/uL — ABNORMAL LOW (ref 150–400)
RBC: 3.98 MIL/uL — ABNORMAL LOW (ref 4.22–5.81)
RDW: 13.6 % (ref 11.5–15.5)
WBC: 7 10*3/uL (ref 4.0–10.5)

## 2013-08-12 LAB — BASIC METABOLIC PANEL
BUN: 5 mg/dL — AB (ref 6–23)
CO2: 27 mEq/L (ref 19–32)
Calcium: 8.7 mg/dL (ref 8.4–10.5)
Chloride: 98 mEq/L (ref 96–112)
Creatinine, Ser: 0.61 mg/dL (ref 0.50–1.35)
GFR calc Af Amer: >90 mL/min (ref >90)
Glucose, Bld: 100 mg/dL — ABNORMAL HIGH (ref 70–99)
Potassium: 3.9 mEq/L (ref 3.7–5.3)
Sodium: 136 mEq/L — ABNORMAL LOW (ref 137–147)

## 2013-08-12 NOTE — Progress Notes (Signed)
3 Days Post-Op Procedure(s) (LRB): VIDEO ASSISTED THORACOSCOPY (VATS)/WEDGE RESECTION AND LEFT LOWER LOBECTOMY (Left) Subjective: Some incisional pain NO BM Otherwise feels well  Objective: Vital signs in last 24 hours: Temp:  [98.2 F (36.8 C)-98.9 F (37.2 C)] 98.2 F (36.8 C) (01/22 0834) Pulse Rate:  [59-92] 70 (01/22 1000) Cardiac Rhythm:  [-] Normal sinus rhythm (01/22 1000) Resp:  [12-20] 19 (01/22 1000) BP: (129-166)/(63-98) 146/96 mmHg (01/22 1000) SpO2:  [91 %-98 %] 98 % (01/22 1000)  Hemodynamic parameters for last 24 hours:    Intake/Output from previous day: 01/21 0701 - 01/22 0700 In: 755 [P.O.:240; I.V.:515] Out: 3415 [Urine:3075; Chest Tube:340] Intake/Output this shift: Total I/O In: 61 [I.V.:61] Out: 300 [Urine:300]  General appearance: alert and no distress Neurologic: intact Heart: regular rate and rhythm Lungs: diminished breath sounds left base Wound: clean and dry no air leak  Lab Results:  Recent Labs  08/11/13 0332 08/12/13 0225  WBC 6.9 7.0  HGB 12.8* 13.2  HCT 36.6* 37.1*  PLT 114* 125*   BMET:  Recent Labs  08/11/13 0332 08/12/13 0225  NA 135* 136*  K 3.6* 3.9  CL 98 98  CO2 25 27  GLUCOSE 114* 100*  BUN 6 5*  CREATININE 0.56 0.61  CALCIUM 8.3* 8.7    PT/INR: No results found for this basename: LABPROT, INR,  in the last 72 hours ABG    Component Value Date/Time   PHART 7.387 08/10/2013 0448   HCO3 28.1* 08/10/2013 0448   TCO2 30 08/10/2013 0448   O2SAT 91.0 08/10/2013 0448   CBG (last 3)   Recent Labs  08/11/13 0753 08/11/13 1219  GLUCAP 122* 101*    Assessment/Plan: S/P Procedure(s) (LRB): VIDEO ASSISTED THORACOSCOPY (VATS)/WEDGE RESECTION AND LEFT LOWER LOBECTOMY (Left) POD # 3 Doing well Pain improving Ambulating well No air leak, but a little too much drainage to dc CT today No bed on 3300 Transfer to [place] when CT out   LOS: 3 days    Jenika Chiem C 08/12/2013

## 2013-08-13 ENCOUNTER — Inpatient Hospital Stay (HOSPITAL_COMMUNITY): Payer: Medicaid Other

## 2013-08-13 MED ORDER — LACTULOSE 10 GM/15ML PO SOLN
10.0000 g | Freq: Every day | ORAL | Status: DC | PRN
  Administered 2013-08-13: 10 g via ORAL
  Filled 2013-08-13: qty 15

## 2013-08-13 MED ORDER — BISACODYL 10 MG RE SUPP
10.0000 mg | Freq: Every day | RECTAL | Status: DC | PRN
Start: 2013-08-13 — End: 2013-08-14

## 2013-08-13 MED ORDER — MAGNESIUM HYDROXIDE 400 MG/5ML PO SUSP: 30.0000 mL | Freq: Every day | ORAL | Status: DC | PRN

## 2013-08-13 NOTE — Discharge Summary (Signed)
[address]       [place] [address]             [phone]              Discharge Summary  Name: Aaron Conway DOB: 1950/11/14 63 y.o. MRN: [id number]   Admission Date: 08/09/2013 Discharge Date: 08/14/2013    Admitting Diagnosis: Left lower lobe lung mass   Discharge Diagnosis:  Invasive well differentiated adenocarcinoma (T2, N0)  Past Medical History  Diagnosis Date  . Arthritis   . Partial small bowel obstruction 04/23/13  . Lupus     Skin involvement only  . Pneumonia     childhood     Procedures: LEFT VIDEO ASSISTED THORACOSCOPY -08/09/2013  LEFT LOWER LOBE WEDGE RESECTION   LEFT LOWER LOBE BASILAR SEGMENTECTOMY  MEDIASTINAL LYMPH NODE DISSECTION    HPI:  The patient is a 63 y.o. male with a long history of tobacco abuse (approximately 50 pack years) who was admitted back in October with a small bowel obstruction. He eventually recovered from the small bowel obstruction without requiring surgery, but during that admission, he had a CT of the abdomen and pelvis. On the upper cuts of the CT, he was noted to have a small cavitary lesion at the base of the left lower lobe. This led to a CT of the chest, which found a 6 mm nodule in the right upper lobe, probable scarring with air bronchograms at the right base, and confirmed the cavitary mass in the left lower lobe.   He was referred to Dr. Melvyn Novas for evaluation of the lung abnormalities. Dr. Melvyn Novas did PFTs, which showed moderate impairment of his diffusion capacity, but only small airway obstruction. He also ordered a PET/CT, which demonstrated mild hypermetabolic uptake in the left lower lobe cavitary lesion as well as the 6 mm nodule in the right lung. There was no hypermetabolic uptake within the scarring at the right base. There also was no evidence of hilar or mediastinal adenopathy.  He then was referred to Dr. Julien Nordmann. His case was discussed at [facility] and he was subsequently  referred to Dr. Roxan Hockey for surgical evaluation.   He has an occasional cough. He denies hemoptysis and wheezing. He did have pneumonia as a child and has had a couple of episodes of bronchitis as an adult. He denies any chest pain, pressure, or tightness. He denies shortness of breath at rest or with exertion. He says that he has gained significant weight over the past 3 months, he estimates 20 pounds. He says he currently is smoking about one fourth of a pack of cigarettes a day area he started smoking 45 years ago and at the most smoked 2 packs a day, he thinks he averaged about one pack a day.  It was felt that he would benefit from surgical resection for definitive diagnosis. All risks, benefits and alternatives of surgery were explained in detail, and the patient agreed to proceed.    Hospital Course:  The patient was admitted to [facility] on 08/09/2013. The patient was taken to the operating room and underwent the above procedure.    The postoperative course was notable for hypertension.  He was started on Lopressor, but blood pressures remained elevated. Norvasc was added to his medication regimen and his blood pressures did improve.    He has otherwise done well.  Chest tubes have removed in the standard fashion, and chest x-rays have remained stable.  He  is ambulating in the hall without difficulty and tolerating a regular diet.  Final operative pathology revealed invasive well differentiated adenocarcinoma (T2, N0).  He is medically stable on today's date for discharge home.    Recent vital signs:  Filed Vitals:   08/13/13 0800  BP:   Pulse:   Temp:   Resp: 18    Recent laboratory studies:  CBC: Recent Labs  08/11/13 0332 08/12/13 0225  WBC 6.9 7.0  HGB 12.8* 13.2  HCT 36.6* 37.1*  PLT 114* 125*   BMET:  Recent Labs  08/11/13 0332 08/12/13 0225  NA 135* 136*  K 3.6* 3.9  CL 98 98  CO2 25 27  GLUCOSE 114* 100*  BUN 6 5*  CREATININE 0.56 0.61  CALCIUM 8.3*  8.7    PT/INR: No results found for this basename: LABPROT, INR,  in the last 72 hours   Discharge Medications:     Medication List    STOP taking these medications       naproxen sodium 220 MG tablet  Commonly known as:  ANAPROX      TAKE these medications       amLODipine 5 MG tablet  Commonly known as:  NORVASC  Take 1 tablet (5 mg total) by mouth daily.     metoprolol tartrate 12.5 mg Tabs tablet  Commonly known as:  LOPRESSOR  Take 0.5 tablets (12.5 mg total) by mouth 2 (two) times daily.     oxyCODONE-acetaminophen 5-325 MG per tablet  Commonly known as:  PERCOCET/ROXICET  Take 1-2 tablets by mouth every 4 (four) hours as needed for severe pain.         Discharge Instructions:  The patient is to refrain from driving, heavy lifting or strenuous activity.  May shower daily and clean incisions with soap and water.  May resume regular diet.    Follow Up: Follow-up Information   Follow up with Melrose Nakayama, MD On 08/31/2013. (Have chest x-ray at [facility] at 11:00, then see MD at 12:00)    Specialty:  Cardiothoracic Surgery   Contact information:   [address] [place] [address] [phone]       Follow up with [facility] NURSE On 08/20/2013. (Dr. Leonarda Salon nurse will remove sutures at 10:00 )            [place] 08/13/2013, 9:43 AM

## 2013-08-13 NOTE — Progress Notes (Signed)
Soft, raised area laterally to CT noticed after removing tape. PA notified and received orders to continue to pull CT. CT site was WNL. CT removed per order. Occlusive dsg applied. Suture tied around site. CXR ordered for 1200. Pt educated to inform nursing staff if SOB occurs. Will continue to monitor pt closely.

## 2013-08-13 NOTE — Progress Notes (Addendum)
       [address]       [place] [address]             [phone]          4 Days Post-Op Procedure(s) (LRB): VIDEO ASSISTED THORACOSCOPY (VATS)/WEDGE RESECTION AND LEFT LOWER LOBECTOMY (Left)  Subjective: Feels well, main complaint is constipation. Breathing stable.   Objective: Vital signs in last 24 hours: Patient Vitals for the past 24 hrs:  BP Temp Temp src Pulse Resp SpO2  08/13/13 0423 - - - - 20 95 %  08/13/13 0414 137/94 mmHg 98.4 F (36.9 C) Oral 61 17 96 %  08/13/13 0031 - - - - 17 98 %  08/12/13 2000 147/80 mmHg 98.4 F (36.9 C) Oral 66 15 97 %  08/12/13 1800 130/76 mmHg - - 59 19 100 %  08/12/13 1627 - 98 F (36.7 C) Oral - - -  08/12/13 1600 136/72 mmHg - - 69 18 98 %  08/12/13 1500 - - - 65 17 96 %  08/12/13 1400 - - - 70 23 99 %  08/12/13 1300 - - - 60 12 97 %  08/12/13 1206 - 98 F (36.7 C) Oral - - -  08/12/13 1200 132/82 mmHg - - 61 13 97 %  08/12/13 1100 - - - 65 17 96 %  08/12/13 1000 146/96 mmHg - - 70 19 98 %  08/12/13 0900 - - - 65 16 98 %  08/12/13 0834 - 98.2 F (36.8 C) Oral - - -  08/12/13 0800 152/91 mmHg - - 64 18 96 %   Current Weight  08/10/13 153 lb 14.1 oz (69.8 kg)     Intake/Output from previous day: 01/22 0701 - 01/23 0700 In: 279 [I.V.:279] Out: 2020 [Urine:2000; Chest Tube:20]    PHYSICAL EXAM:  Heart: RRR Lungs: Diminished BS on L Wound: Clean and dry Chest tube: No air leak    Lab Results: CBC: Recent Labs  08/11/13 0332 08/12/13 0225  WBC 6.9 7.0  HGB 12.8* 13.2  HCT 36.6* 37.1*  PLT 114* 125*   BMET:  Recent Labs  08/11/13 0332 08/12/13 0225  NA 135* 136*  K 3.6* 3.9  CL 98 98  CO2 25 27  GLUCOSE 114* 100*  BUN 6 5*  CREATININE 0.56 0.61  CALCIUM 8.3* 8.7    PT/INR: No results found for this basename: LABPROT, INR,  in the last 72 hours    Assessment/Plan: S/P Procedure(s) (LRB): VIDEO ASSISTED THORACOSCOPY (VATS)/WEDGE RESECTION AND LEFT LOWER LOBECTOMY  (Left) CT output scant and no air leak.  No CXR done this am- will order.  Hopefully can d/c remaining CT today. Will Rx LOC today. Ambulate, continue pulm toilet. Home 1-2 days if remains stable.   LOS: 4 days    COLLINS,GINA H 08/13/2013  Drainage down, < 150 ml last 24 hours No air leak DC chest tube, dc pca, home in AM

## 2013-08-14 ENCOUNTER — Inpatient Hospital Stay (HOSPITAL_COMMUNITY): Payer: Medicaid Other

## 2013-08-14 MED ORDER — METOPROLOL TARTRATE 12.5 MG HALF TABLET: 12.5000 mg | ORAL_TABLET | Freq: Two times a day (BID) | ORAL | Status: DC

## 2013-08-14 MED ORDER — OXYCODONE-ACETAMINOPHEN 5-325 MG PO TABS: 1.0000 | ORAL_TABLET | ORAL | Status: DC | PRN

## 2013-08-14 MED ORDER — WHITE PETROLATUM GEL
Status: DC | PRN
  Filled 2013-08-14: qty 5

## 2013-08-14 MED ORDER — AMLODIPINE BESYLATE 5 MG PO TABS: 5.0000 mg | ORAL_TABLET | Freq: Every day | ORAL | Status: DC

## 2013-08-14 NOTE — Progress Notes (Signed)
[address]       [employer] [address]             [phone]      5 Days Post-Op  Procedure(s) (LRB): VIDEO ASSISTED THORACOSCOPY (VATS)/WEDGE RESECTION AND LEFT LOWER LOBECTOMY (Left) Subjective: Feels ok, no SOB, only mild pain  Objective  Telemetry sinus, occ PVC's   Temp:  [97.8 F (36.6 C)-99.3 F (37.4 C)] 98.6 F (37 C) (01/24 0428) Pulse Rate:  [64-70] 64 (01/24 0428) Resp:  [16-20] 18 (01/24 0428) BP: (115-138)/(70-86) 128/75 mmHg (01/24 0428) SpO2:  [94 %-97 %] 95 % (01/24 0428)   Intake/Output Summary (Last 24 hours) at 08/14/13 0832 Last data filed at 08/14/13 0159  Gross per 24 hour  Intake    360 ml  Output      0 ml  Net    360 ml       General appearance: alert, cooperative and no distress Heart: regular rate and rhythm Lungs: min dim in bases Abdomen: benign exam Extremities: no edema Wound: incis healing well  Lab Results:  Recent Labs  08/12/13 0225  NA 136*  K 3.9  CL 98  CO2 27  GLUCOSE 100*  BUN 5*  CREATININE 0.61  CALCIUM 8.7   No results found for this basename: AST, ALT, ALKPHOS, BILITOT, PROT, ALBUMIN,  in the last 72 hours No results found for this basename: LIPASE, AMYLASE,  in the last 72 hours  Recent Labs  08/12/13 0225  WBC 7.0  HGB 13.2  HCT 37.1*  MCV 93.2  PLT 125*   No results found for this basename: CKTOTAL, CKMB, TROPONINI,  in the last 72 hours No components found with this basename: POCBNP,  No results found for this basename: DDIMER,  in the last 72 hours No results found for this basename: HGBA1C,  in the last 72 hours No results found for this basename: CHOL, HDL, LDLCALC, TRIG, CHOLHDL,  in the last 72 hours No results found for this basename: TSH, T4TOTAL, FREET3, T3FREE, THYROIDAB,  in the last 72 hours No results found for this basename: VITAMINB12, FOLATE, FERRITIN, TIBC, IRON, RETICCTPCT,  in the last 72 hours  Medications: Scheduled . amLODipine  5 mg Oral  Daily  . bisacodyl  10 mg Oral Daily  . enoxaparin (LOVENOX) injection  40 mg Subcutaneous Q24H  . metoprolol tartrate  12.5 mg Oral BID  . pantoprazole  40 mg Oral Daily     Radiology/Studies:  Dg Chest 2 View  08/14/2013   CLINICAL DATA:  Lung cancer status post left VATS  EXAM: CHEST  2 VIEW  COMPARISON:  Prior chest x-ray 08/13/2013  FINDINGS: Improved inspiratory volumes with the decreased subsegmental atelectasis. Linear pleural parenchymal scarring in the bases persists. No pneumothorax. Cardiac and mediastinal contours are unchanged. Persistent enlargement of the main and central pulmonary arteries suggesting pulmonary arterial hypertension. Atherosclerotic calcification noted in the transverse aorta. Trace bilateral pleural effusions versus pleural thickening. No acute osseous abnormality.  IMPRESSION: Improved inspiratory volumes with decreasing bibasilar atelectasis.  No pneumothorax.   Electronically Signed   By: Jacqulynn Cadet M.D.   On: 08/14/2013 08:01   Dg Chest 2 View  08/13/2013   CLINICAL DATA:  Chest tube removal. 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  EXAM: CHEST  2 VIEW 12:05 p.m.  COMPARISON:  Chest x-rays dated 08/13/2013 at 8:09 a.m. and 08/12/2013  FINDINGS: No pneumothorax. The mediastinal air seen  on the prior study has almost completely resolved. Slight atelectasis at the left base has improved.  There is slight atelectasis at the right base. Heart size and vascularity are normal. There is a tiny right effusion.  IMPRESSION: No pneumothorax after chest tube removal. Slight bibasilar atelectasis. Tiny right pleural effusion.   Electronically Signed   By: Rozetta Nunnery M.D.   On: 08/13/2013 15:59   Dg Chest Port 1v Same Day  08/13/2013   CLINICAL DATA:  Status post left VATS procedure pole are not left lower lung carcinoma.  EXAM: PORTABLE CHEST - 1 VIEW SAME DAY  COMPARISON:  08/12/2013  FINDINGS: Left chest tube in place. Aeration at the left  lung base is mildly improved with some residual atelectasis remaining. No pneumothorax is identified. There remains likely a small amount of air in the mediastinum adjacent to the aortic knob and main pulmonary artery segment. No edema is identified. The heart size and mediastinal contours are stable.  IMPRESSION: No pneumothorax. Small amount of mediastinal air adjacent to the aortic knob and main pulmonary artery segment.   Electronically Signed   By: Aletta Edouard M.D.   On: 08/13/2013 08:25    INR: Will add last result for INR, ABG once components are confirmed Will add last 4 CBG results once components are confirmed  Assessment/Plan: S/P Procedure(s) (LRB): VIDEO ASSISTED THORACOSCOPY (VATS)/WEDGE RESECTION AND LEFT LOWER LOBECTOMY (Left) Plan for discharge: see discharge orders   LOS: 5 days    GOLD,WAYNE E 1/24/20158:32 AM

## 2013-08-14 NOTE — Progress Notes (Signed)
Wasted 29ml of fentyl out of PCA pump in patient's room at discharge. Elmarie Shiley, RN witnessed the waste. Glade Nurse, RN

## 2013-08-14 NOTE — Progress Notes (Signed)
Discussed discharge summary to patient, including follow-up appointments, medications, and my chart instructions. Discontinued IV sites and arranged for transport for patient going home. Glade Nurse, RN

## 2013-08-14 NOTE — Discharge Instructions (Signed)
Thoracotomy, Care After  These instructions give you information on caring for yourself after your procedure. Your doctor may also give you more specific instructions. Call your doctor if you have any problems or questions after your procedure. HOME CARE  Only take medicine as told by your doctor. Take pain medicine before your pain gets severe.  Take deep breaths to protect yourself against a lung infection (pneumonia).  Cough often to clear thick spit (mucus) and to open your lungs. Hold a pillow against your chest if it hurts to cough.  Keep using your breathing machine (incentive spirometer) as told.  Change bandages (dressings) as needed, or as told by your doctor.  Take off the bandage over your chest tube area as told by your doctor.  Continue your normal diet as told by your doctor.  Eat high-fiber foods. This includes whole grain cereals, brown rice, beans, and fresh fruits and vegetables.  Drink enough fluids to keep your pee (urine) clear or pale yellow. Avoid caffeine.  Talk to your doctor about taking a medicine to soften your poop (stool softener, laxative).  Continue activities as told by your doctor. Balance your activity with rest.  Avoid lifting or driving until your doctor approves.  Take showers until you see your doctor, or as told. Gently wash the area of your surgical cut (incision) with soap and water as told. Do not take baths or sit in a hot tub until your doctor approves.  Do not smoke or use tobacco. Avoid being around others who smoke.  Schedule a doctor visit to get your stitches (sutures) or staples removed at told.  Schedule and go to all doctor visits as told.  Go to therapy that can help improve your lungs (pulmonary rehabilitation) as told by your doctor.  Do not travel by airplane for 2 weeks after the chest tube is taken out. GET HELP IF:  You are bleeding from your wounds.  You have redness, puffiness (swelling), or more pain in the  wounds.  You feel your heart beating fast or skipping beats (irregular heartbeat).  You have yellowish-white fluid (pus) coming from your wounds.  You have a bad smell coming from the wound or bandage.  You have a fever or chills.  You feel sick to your stomach or you vomit.  You have muscle aches. GET HELP RIGHT AWAY IF:  You have a rash.  You have trouble breathing.  Your medicines are causing you problems.  You keep feeling sick to your stomach (nauseous).  You feel lightheaded.  You have shortness of breath or chest pain.  You have pain that will not go away.  You are bleeding from your wounds.  You have redness, puffiness (swelling), or more pain in the wounds.  You feel your heart beating fast or skipping beats (irregular heartbeat).  You have yellowish-white fluid (pus) coming from your wounds.  You have a bad smell coming from the wound or bandage.  You have a fever or chills.  You feel sick to your stomach or you vomit.  You have muscle aches. MAKE SURE YOU:  Understand these instructions.  Will watch your condition.  Will get help right away if you are not doing well or get worse. Document Released: 01/07/2012 Document Revised: 04/28/2013 Document Reviewed: 02/24/2013 [facility] Patient Information 2014 [place], [place].

## 2013-08-16 ENCOUNTER — Encounter: Payer: Self-pay | Admitting: *Deleted

## 2013-08-16 ENCOUNTER — Telehealth: Payer: Self-pay | Admitting: *Deleted

## 2013-08-16 NOTE — Telephone Encounter (Signed)
Called and spoke with pt regarding appt with Dr. Julien Nordmann 08/24/13 11:00 labs/FC and Dr. Julien Nordmann at 11:30.  Pt verbalized understanding of appt time and place

## 2013-08-16 NOTE — Telephone Encounter (Signed)
Pt called back to get clarification on appt time.  I clarified and he verbalized understanding of appt time and place

## 2013-08-17 ENCOUNTER — Encounter (HOSPITAL_COMMUNITY): Payer: Self-pay

## 2013-08-17 ENCOUNTER — Telehealth: Payer: Self-pay | Admitting: *Deleted

## 2013-08-17 NOTE — Telephone Encounter (Signed)
FISH study, ALK not detected.  FISH study and surgical pathology given to Dr Vista Mink to review.  SLJ

## 2013-08-20 ENCOUNTER — Encounter (INDEPENDENT_AMBULATORY_CARE_PROVIDER_SITE_OTHER): Payer: Self-pay

## 2013-08-20 DIAGNOSIS — I2510 Atherosclerotic heart disease of native coronary artery without angina pectoris: Secondary | ICD-10-CM

## 2013-08-24 ENCOUNTER — Ambulatory Visit (HOSPITAL_BASED_OUTPATIENT_CLINIC_OR_DEPARTMENT_OTHER): Payer: Medicaid Other | Admitting: Internal Medicine

## 2013-08-24 ENCOUNTER — Other Ambulatory Visit (HOSPITAL_BASED_OUTPATIENT_CLINIC_OR_DEPARTMENT_OTHER): Payer: Medicaid Other

## 2013-08-24 ENCOUNTER — Other Ambulatory Visit: Payer: Self-pay | Admitting: Internal Medicine

## 2013-08-24 ENCOUNTER — Telehealth: Payer: Self-pay | Admitting: Internal Medicine

## 2013-08-24 ENCOUNTER — Encounter: Payer: Self-pay | Admitting: Internal Medicine

## 2013-08-24 VITALS — BP 135/80 | HR 64 | Temp 98.5°F | Resp 18 | Ht 65.0 in | Wt 155.2 lb

## 2013-08-24 DIAGNOSIS — R599 Enlarged lymph nodes, unspecified: Secondary | ICD-10-CM

## 2013-08-24 DIAGNOSIS — C3430 Malignant neoplasm of lower lobe, unspecified bronchus or lung: Secondary | ICD-10-CM

## 2013-08-24 DIAGNOSIS — R918 Other nonspecific abnormal finding of lung field: Secondary | ICD-10-CM

## 2013-08-24 DIAGNOSIS — C3490 Malignant neoplasm of unspecified part of unspecified bronchus or lung: Secondary | ICD-10-CM | POA: Insufficient documentation

## 2013-08-24 DIAGNOSIS — R911 Solitary pulmonary nodule: Secondary | ICD-10-CM

## 2013-08-24 LAB — CBC WITH DIFFERENTIAL/PLATELET
BASO%: 1.5 % (ref 0.0–2.0)
Basophils Absolute: 0.1 10*3/uL (ref 0.0–0.1)
EOS%: 6.7 % (ref 0.0–7.0)
Eosinophils Absolute: 0.2 10*3/uL (ref 0.0–0.5)
HCT: 39.6 % (ref 38.4–49.9)
HGB: 13 g/dL (ref 13.0–17.1)
LYMPH%: 46 % (ref 14.0–49.0)
MCH: 32.4 pg (ref 27.2–33.4)
MCHC: 32.7 g/dL (ref 32.0–36.0)
MCV: 99 fL — ABNORMAL HIGH (ref 79.3–98.0)
MONO#: 0.4 10*3/uL (ref 0.1–0.9)
MONO%: 10.3 % (ref 0.0–14.0)
NEUT#: 1.2 10*3/uL — ABNORMAL LOW (ref 1.5–6.5)
NEUT%: 35.5 % — AB (ref 39.0–75.0)
Platelets: 235 10*3/uL (ref 140–400)
RBC: 4 10*6/uL — AB (ref 4.20–5.82)
RDW: 14 % (ref 11.0–14.6)
WBC: 3.5 10*3/uL — ABNORMAL LOW (ref 4.0–10.3)
lymph#: 1.6 10*3/uL (ref 0.9–3.3)

## 2013-08-24 LAB — COMPREHENSIVE METABOLIC PANEL (CC13)
ALT: 20 U/L (ref 0–55)
AST: 30 U/L (ref 5–34)
Albumin: 3.3 g/dL — ABNORMAL LOW (ref 3.5–5.0)
Alkaline Phosphatase: 50 U/L (ref 40–150)
Anion Gap: 10 mEq/L (ref 3–11)
BUN: 8.6 mg/dL (ref 7.0–26.0)
CHLORIDE: 99 meq/L (ref 98–109)
CO2: 28 mEq/L (ref 22–29)
CREATININE: 0.7 mg/dL (ref 0.7–1.3)
Calcium: 9.3 mg/dL (ref 8.4–10.4)
Glucose: 93 mg/dl (ref 70–140)
POTASSIUM: 4.3 meq/L (ref 3.5–5.1)
Sodium: 137 mEq/L (ref 136–145)
Total Bilirubin: 0.41 mg/dL (ref 0.20–1.20)
Total Protein: 7.7 g/dL (ref 6.4–8.3)

## 2013-08-24 NOTE — Telephone Encounter (Signed)
, °

## 2013-08-24 NOTE — Patient Instructions (Signed)
Smoking Cessation Quitting smoking is important to your health and has many advantages. However, it is not always easy to quit since nicotine is a very addictive drug. Often times, people try 3 times or more before being able to quit. This document explains the best ways for you to prepare to quit smoking. Quitting takes hard work and a lot of effort, but you can do it. ADVANTAGES OF QUITTING SMOKING  You will live longer, feel better, and live better.  Your body will feel the impact of quitting smoking almost immediately.  Within 20 minutes, blood pressure decreases. Your pulse returns to its normal level.  After 8 hours, carbon monoxide levels in the blood return to normal. Your oxygen level increases.  After 24 hours, the chance of having a heart attack starts to decrease. Your breath, hair, and body stop smelling like smoke.  After 48 hours, damaged nerve endings begin to recover. Your sense of taste and smell improve.  After 72 hours, the body is virtually free of nicotine. Your bronchial tubes relax and breathing becomes easier.  After 2 to 12 weeks, lungs can hold more air. Exercise becomes easier and circulation improves.  The risk of having a heart attack, stroke, cancer, or lung disease is greatly reduced.  After 1 year, the risk of coronary heart disease is cut in half.  After 5 years, the risk of stroke falls to the same as a nonsmoker.  After 10 years, the risk of lung cancer is cut in half and the risk of other cancers decreases significantly.  After 15 years, the risk of coronary heart disease drops, usually to the level of a nonsmoker.  If you are pregnant, quitting smoking will improve your chances of having a healthy baby.  The people you live with, especially any children, will be healthier.  You will have extra money to spend on things other than cigarettes. QUESTIONS TO THINK ABOUT BEFORE ATTEMPTING TO QUIT You may want to talk about your answers with your  caregiver.  Why do you want to quit?  If you tried to quit in the past, what helped and what did not?  What will be the most difficult situations for you after you quit? How will you plan to handle them?  Who can help you through the tough times? Your family? Friends? A caregiver?  What pleasures do you get from smoking? What ways can you still get pleasure if you quit? Here are some questions to ask your caregiver:  How can you help me to be successful at quitting?  What medicine do you think would be best for me and how should I take it?  What should I do if I need more help?  What is smoking withdrawal like? How can I get information on withdrawal? GET READY  Set a quit date.  Change your environment by getting rid of all cigarettes, ashtrays, matches, and lighters in your home, car, or work. Do not let people smoke in your home.  Review your past attempts to quit. Think about what worked and what did not. GET SUPPORT AND ENCOURAGEMENT You have a better chance of being successful if you have help. You can get support in many ways.  Tell your family, friends, and co-workers that you are going to quit and need their support. Ask them not to smoke around you.  Get individual, group, or telephone counseling and support. Programs are available at local hospitals and health centers. Call your local health department for   information about programs in your area.  Spiritual beliefs and practices may help some smokers quit.  Download a "quit meter" on your computer to keep track of quit statistics, such as how long you have gone without smoking, cigarettes not smoked, and money saved.  Get a self-help book about quitting smoking and staying off of tobacco. LEARN NEW SKILLS AND BEHAVIORS  Distract yourself from urges to smoke. Talk to someone, go for a walk, or occupy your time with a task.  Change your normal routine. Take a different route to work. Drink tea instead of coffee.  Eat breakfast in a different place.  Reduce your stress. Take a hot bath, exercise, or read a book.  Plan something enjoyable to do every day. Reward yourself for not smoking.  Explore interactive web-based programs that specialize in helping you quit. GET MEDICINE AND USE IT CORRECTLY Medicines can help you stop smoking and decrease the urge to smoke. Combining medicine with the above behavioral methods and support can greatly increase your chances of successfully quitting smoking.  Nicotine replacement therapy helps deliver nicotine to your body without the negative effects and risks of smoking. Nicotine replacement therapy includes nicotine gum, lozenges, inhalers, nasal sprays, and skin patches. Some may be available over-the-counter and others require a prescription.  Antidepressant medicine helps people abstain from smoking, but how this works is unknown. This medicine is available by prescription.  Nicotinic receptor partial agonist medicine simulates the effect of nicotine in your brain. This medicine is available by prescription. Ask your caregiver for advice about which medicines to use and how to use them based on your health history. Your caregiver will tell you what side effects to look out for if you choose to be on a medicine or therapy. Carefully read the information on the package. Do not use any other product containing nicotine while using a nicotine replacement product.  RELAPSE OR DIFFICULT SITUATIONS Most relapses occur within the first 3 months after quitting. Do not be discouraged if you start smoking again. Remember, most people try several times before finally quitting. You may have symptoms of withdrawal because your body is used to nicotine. You may crave cigarettes, be irritable, feel very hungry, cough often, get headaches, or have difficulty concentrating. The withdrawal symptoms are only temporary. They are strongest when you first quit, but they will go away within  10 14 days. To reduce the chances of relapse, try to:  Avoid drinking alcohol. Drinking lowers your chances of successfully quitting.  Reduce the amount of caffeine you consume. Once you quit smoking, the amount of caffeine in your body increases and can give you symptoms, such as a rapid heartbeat, sweating, and anxiety.  Avoid smokers because they can make you want to smoke.  Do not let weight gain distract you. Many smokers will gain weight when they quit, usually less than 10 pounds. Eat a healthy diet and stay active. You can always lose the weight gained after you quit.  Find ways to improve your mood other than smoking. FOR MORE INFORMATION  www.smokefree.gov  Document Released: 07/02/2001 Document Revised: 01/07/2012 Document Reviewed: 10/17/2011 [employer] Patient Information 2014 [employer].  

## 2013-08-24 NOTE — Progress Notes (Signed)
[facility] [phone]   [phone]  OFFICE PROGRESS NOTE  Provider Not In System No address on file  DIAGNOSIS: Stage IB (T2a, N0, M0) non-small cell lung cancer, adenocarcinoma diagnosed in January of 2015  PRIOR THERAPY: Status post left video-assisted thoracoscopy, wedge resection left lower lobe nodule, thoracoscopic left lower lobe basilar segmentectomy, mediastinal lymph node dissection under the care of Dr. Roxan Hockey on 08/09/2013  CURRENT THERAPY: None  INTERVAL HISTORY: Aaron Conway 63 y.o. male returns to the clinic today for followup visit. The patient is feeling fine today with no specific complaints except for soreness at the left side of the chest after the surgical resection. He denied having any significant shortness of breath, cough or hemoptysis. He denied having any significant weight loss or night sweats. He has no fever or chills. He has no nausea or vomiting. He recently underwent wedge resection of the left lower lobe and lymph node dissection under the care of Dr. Roxan Hockey. The final pathology (Accession: [id number]) showed invasive well-differentiated adenocarcinoma spanning 1.3 CM with adenocarcinoma focally involved the visceral pleura and there was adenocarcinoma present at the surgical resection margin of the specimen 1. Further dissection was performed and it was invasive well-differentiated adenocarcinoma spanning 0.8 CM with adenocarcinoma present at the inked tissue edge of the specimen #2 but the final resection margin was negative for adenocarcinoma. The dissected lymph nodes were negative for malignancy, the distance to closest margin was 0.3 CM. The tissue blocks were sent for molecular studies and it was negative for EGFR mutation as well as ALK gene translocation. This was for evaluation and discussion of his treatment options. He has no peripheral subcutaneous nodules on the back of his neck one of them is  concerning for infected cyst and he would like referral for surgical evaluation.  MEDICAL HISTORY: Past Medical History  Diagnosis Date  . Arthritis   . Partial small bowel obstruction 04/23/13  . Lupus     Skin involvement only  . Pneumonia     childhood    ALLERGIES:  has No Known Allergies.  MEDICATIONS:  Current Outpatient Prescriptions  Medication Sig Dispense Refill  . amLODipine (NORVASC) 5 MG tablet Take 1 tablet (5 mg total) by mouth daily.  30 tablet  1  . metoprolol tartrate (LOPRESSOR) 12.5 mg TABS tablet Take 0.5 tablets (12.5 mg total) by mouth 2 (two) times daily.  60 each  1  . oxyCODONE-acetaminophen (PERCOCET/ROXICET) 5-325 MG per tablet Take 1-2 tablets by mouth every 4 (four) hours as needed for severe pain.  50 tablet  0   No current facility-administered medications for this visit.    SURGICAL HISTORY:  Past Surgical History  Procedure Laterality Date  . Back surgery      Lumbar- one spurs  . Neck surgery      Cervical  . Stabbed and had abdominal surgery    . Video assisted thoracoscopy (vats)/wedge resection Left 08/09/2013    Procedure: VIDEO ASSISTED THORACOSCOPY (VATS)/WEDGE RESECTION AND LEFT LOWER LOBECTOMY;  Surgeon: Melrose Nakayama, MD;  Location: [facility];  Service: Thoracic;  Laterality: Left;    REVIEW OF SYSTEMS:  Constitutional: negative Eyes: negative Ears, nose, mouth, throat, and face: negative Respiratory: positive for pleurisy/chest pain Cardiovascular: negative Gastrointestinal: negative Genitourinary:negative Integument/breast: negative Hematologic/lymphatic: negative Musculoskeletal:negative Neurological: negative Behavioral/Psych: negative Endocrine: negative Allergic/Immunologic: negative   PHYSICAL EXAMINATION: General appearance: alert, cooperative and no distress Head: Normocephalic, without obvious abnormality, atraumatic Neck: no adenopathy,  no JVD, supple, symmetrical, trachea midline and thyroid not enlarged,  symmetric, no tenderness/mass/nodules Lymph nodes: Cervical, supraclavicular, and axillary nodes normal. Resp: clear to auscultation bilaterally Back: symmetric, no curvature. ROM normal. No CVA tenderness. Cardio: regular rate and rhythm, S1, S2 normal, no murmur, click, rub or gallop GI: soft, non-tender; bowel sounds normal; no masses,  no organomegaly Extremities: extremities normal, atraumatic, no cyanosis or edema Neurologic: Alert and oriented X 3, normal strength and tone. Normal symmetric reflexes. Normal coordination and gait  ECOG PERFORMANCE STATUS: 1 - Symptomatic but completely ambulatory  Blood pressure 135/80, pulse 64, temperature 98.5 F (36.9 C), temperature source Oral, resp. rate 18, height _0  (1.651 m), weight 155 lb 3.2 oz (70.398 kg), SpO2 97.00%.  LABORATORY DATA: Lab Results  Component Value Date   WBC 3.5* 08/24/2013   HGB 13.0 08/24/2013   HCT 39.6 08/24/2013   MCV 99.0* 08/24/2013   PLT 235 08/24/2013      Chemistry      Component Value Date/Time   NA 137 08/24/2013 1044   NA 136* 08/12/2013 0225   K 4.3 08/24/2013 1044   K 3.9 08/12/2013 0225   CL 98 08/12/2013 0225   CO2 28 08/24/2013 1044   CO2 27 08/12/2013 0225   BUN 8.6 08/24/2013 1044   BUN 5* 08/12/2013 0225   CREATININE 0.7 08/24/2013 1044   CREATININE 0.61 08/12/2013 0225      Component Value Date/Time   CALCIUM 9.3 08/24/2013 1044   CALCIUM 8.7 08/12/2013 0225   ALKPHOS 50 08/24/2013 1044   ALKPHOS 37* 08/11/2013 0332   AST 30 08/24/2013 1044   AST 25 08/11/2013 0332   ALT 20 08/24/2013 1044   ALT 12 08/11/2013 0332   BILITOT 0.41 08/24/2013 1044   BILITOT 0.3 08/11/2013 0332       RADIOGRAPHIC STUDIES: Dg Chest 2 View  08/14/2013   CLINICAL DATA:  Lung cancer status post left VATS  EXAM: CHEST  2 VIEW  COMPARISON:  Prior chest x-ray 08/13/2013  FINDINGS: Improved inspiratory volumes with the decreased subsegmental atelectasis. Linear pleural parenchymal scarring in the bases persists. No pneumothorax.  Cardiac and mediastinal contours are unchanged. Persistent enlargement of the main and central pulmonary arteries suggesting pulmonary arterial hypertension. Atherosclerotic calcification noted in the transverse aorta. Trace bilateral pleural effusions versus pleural thickening. No acute osseous abnormality.  IMPRESSION: Improved inspiratory volumes with decreasing bibasilar atelectasis.  No pneumothorax.   Electronically Signed   By: Jacqulynn Cadet M.D.   On: 08/14/2013 08:01   Dg Chest 2 View  08/13/2013   CLINICAL DATA:  Chest tube removal. 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  EXAM: CHEST  2 VIEW 12:05 p.m.  COMPARISON:  Chest x-rays dated 08/13/2013 at 8:09 a.m. and 08/12/2013  FINDINGS: No pneumothorax. The mediastinal air seen on the prior study has almost completely resolved. Slight atelectasis at the left base has improved.  There is slight atelectasis at the right base. Heart size and vascularity are normal. There is a tiny right effusion.  IMPRESSION: No pneumothorax after chest tube removal. Slight bibasilar atelectasis. Tiny right pleural effusion.   Electronically Signed   By: Rozetta Nunnery M.D.   On: 08/13/2013 15:59   Dg Chest 2 View  08/06/2013   CLINICAL DATA:  Preoperative evaluation for PET S, history smoking, lupus  EXAM: CHEST  2 VIEW  COMPARISON:  07/05/2013 ; correlation PET-CT 07/16/2013  FINDINGS: Normal heart size, mediastinal contours, and pulmonary vascularity.  Subsegmental atelectasis at right base, chronic.  Underlying emphysematous and minimal bronchitic changes.  Area of opacity at the lateral left lung base has slightly decreased in size 2.2 x 2.0 cm, previously 2.9 x 2.7 cm.  Questionable vague nodular density in the lateral right mid lung 6 mm diameter, not seen on prior radiograph but corresponding to an 8 mm diameter right upper lobe nodule on prior PET-CT.  Probable bilateral nipple shadows.  No acute infiltrate, pleural effusion or  pneumothorax.  Bones demineralized with thoracolumbar scoliosis.  IMPRESSION: COPD changes with chronic atelectasis at right base.  Interval decrease in size of nodular density at the lateral left lung base.  Stable right upper lobe nodule.  No new abnormalities.   Electronically Signed   By: Lavonia Dana M.D.   On: 08/06/2013 16:20   Dg Chest Port 1 View  08/12/2013   CLINICAL DATA:  Status post left lower lobectomy  EXAM: PORTABLE CHEST - 1 VIEW  COMPARISON:  Prior chest x-ray 08/11/2013  FINDINGS: The apical left chest tube has been removed. The basilar chest tube remains in place. No evidence of pneumothorax. Persistent postsurgical pleural thickening and atelectasis noted in the left base. Mild atelectasis in the right base is also unchanged. Stable cardiac and mediastinal contours. Atherosclerotic calcification noted in the transverse aorta. No acute osseous abnormality.  IMPRESSION: 1. Interval removal of the a typically directed left chest tube without evidence of pneumothorax or complication. The left basilar chest tube remains in place. 2. Persistent low lung volumes with bibasilar atelectasis.   Electronically Signed   By: Jacqulynn Cadet M.D.   On: 08/12/2013 08:17   Dg Chest Port 1 View  08/11/2013   CLINICAL DATA:  Status post the ADT as and left lower lobe. Resection  EXAM: PORTABLE CHEST - 1 VIEW  COMPARISON:  Portable chest x-ray of 10 August 2012  FINDINGS: The pulmonary interstitium of both lungs has improved suggesting resolving interstitial edema. The 2 chest tubes on the left appear unchanged in position. No pneumothorax is visible. There may be a very small amount of pleural fluid at the left lung base. Minimal atelectasis here is present. The right lung exhibits no infiltrate. The cardiac silhouette is mildly enlarged.  IMPRESSION: There has been further interval improvement in the appearance of the pulmonary interstitium suggesting resolving interstitial edema. There is no  pneumothorax. Atelectasis and/or a tiny amount of pleural fluid at the left lung base persists.   Electronically Signed   By: David  Martinique   On: 08/11/2013 07:48   Dg Chest Port 1 View  08/10/2013   CLINICAL DATA:  Video-assisted thoracoscopy with wedge resection and left lower lobectomy  EXAM: PORTABLE CHEST - 1 VIEW  COMPARISON:  Portable chest x-ray dated 09 August 2013  FINDINGS: Knee right lung is borderline hypo inflation did. There is no focal infiltrate. On the left the lung is well-expanded. Two chest tubes are in place. The tip of the uppermost tube lies in the pulmonary apex. The lower most tube lies adjacent to the proximal descending thoracic aorta. There is no evidence of a pneumothorax. No significant pleural effusion is demonstrated. There is persistent atelectasis in the left lower hemithorax. The cardiopericardial silhouette is not enlarged. The pulmonary vascularity is more distinct today.  IMPRESSION: The patient has undergone left lower lobectomy. There is no evidence of a pneumothorax. There is persistent atelectasis in the left lower hemi thorax. No significant pleural fluid collection is demonstrated. Improving interstitial edema is demonstrated  bilaterally.   Electronically Signed   By: David  Martin[place]   On: 08/10/2013 08:16   Dg Chest Portable 1 View  08/09/2013   CLINICAL DATA:  Postop surgery.  Mass in lung.  EXAM: PORTABLE CHEST - 1 VIEW  COMPARISON:  08/06/2013  FINDINGS: Heart size is mildly enlarged. There is pulmonary vascular congestion. Atelectasis is identified in the lung bases are identified. There are two left-sided chest tubes in place. No significant pneumothorax identified.  IMPRESSION: 1. Postoperative changes involving the left hemi thorax. 2. Left chest tubes in place without significant pneumothorax. 3. Bibasilar atelectasis and pulmonary vascular congestion.   Electronically Signed   By: Kerby Moors M.D.   On: 08/09/2013 16:39   Dg Chest Port 1v Same  Day  08/13/2013   CLINICAL DATA:  Status post left VATS procedure pole are not left lower lung carcinoma.  EXAM: PORTABLE CHEST - 1 VIEW SAME DAY  COMPARISON:  08/12/2013  FINDINGS: Left chest tube in place. Aeration at the left lung base is mildly improved with some residual atelectasis remaining. No pneumothorax is identified. There remains likely a small amount of air in the mediastinum adjacent to the aortic knob and main pulmonary artery segment. No edema is identified. The heart size and mediastinal contours are stable.  IMPRESSION: No pneumothorax. Small amount of mediastinal air adjacent to the aortic knob and main pulmonary artery segment.   Electronically Signed   By: Aletta Edouard M.D.   On: 08/13/2013 08:25    ASSESSMENT AND PLAN: This is a very pleasant 63 years old [place] American male recently diagnosed with a stage IB (T2a, N0, M0) non-small cell lung cancer, adenocarcinoma involving the left lower lobe status post wedge resection with lymph node dissection. The tumor involves the visceral pleura. The patient also has a suspicious small pulmonary nodule in the right upper lobe concerning for synchronous primary lung cancer. I have a lengthy discussion with the patient today about his condition. I explained to the patient that there is no survival benefit for adjuvant chemotherapy for patient with stage IB with tumor size less than 4.0 CM. I recommended for the patient to continue on observation with repeat CT scan of the chest in 3 months for reevaluation of his disease as well as close monitoring of the right upper lobe pulmonary nodule. The patient agreed to the current plan. For the back of the neck infected nodule, I will refer the patient to Gen. surgery for evaluation and incision and debridement.  The patient voices understanding of current disease status and treatment options and is in agreement with the current care plan.  All questions were answered. The patient knows to  call the clinic with any problems, questions or concerns. We can certainly see the patient much sooner if necessary.  I spent 15 minutes counseling the patient face to face. The total time spent in the appointment was 25 minutes.  Disclaimer: This note was dictated with voice recognition software. Similar sounding words can inadvertently be transcribed and may not be corrected upon review.

## 2013-08-25 ENCOUNTER — Encounter (INDEPENDENT_AMBULATORY_CARE_PROVIDER_SITE_OTHER): Payer: Self-pay | Admitting: General Surgery

## 2013-08-25 ENCOUNTER — Encounter (INDEPENDENT_AMBULATORY_CARE_PROVIDER_SITE_OTHER): Payer: Self-pay

## 2013-08-25 ENCOUNTER — Ambulatory Visit (INDEPENDENT_AMBULATORY_CARE_PROVIDER_SITE_OTHER): Payer: Medicaid Other | Admitting: General Surgery

## 2013-08-25 VITALS — BP 128/81 | HR 74 | Temp 98.2°F | Resp 18 | Ht 67.0 in | Wt 155.4 lb

## 2013-08-25 DIAGNOSIS — L723 Sebaceous cyst: Secondary | ICD-10-CM

## 2013-08-25 DIAGNOSIS — L089 Local infection of the skin and subcutaneous tissue, unspecified: Secondary | ICD-10-CM

## 2013-08-25 DIAGNOSIS — L729 Follicular cyst of the skin and subcutaneous tissue, unspecified: Principal | ICD-10-CM

## 2013-08-25 MED ORDER — SULFAMETHOXAZOLE-TRIMETHOPRIM 400-80 MG PO TABS: 1.0000 | ORAL_TABLET | Freq: Two times a day (BID) | ORAL | Status: DC

## 2013-08-25 NOTE — Progress Notes (Signed)
Patient ID: Aaron Conway, male   DOB: 1950/10/29, 63 y.o.   MRN: [id number]  Chief Complaint  Patient presents with  . Cyst    neck    HPI Aaron Conway is a 63 y.o. male.  The patient is a 63 year old male who is referred by Dr. Earlie Server for evaluation of a infected sebaceous cyst. This states has been there for the last several days. He had no drainage or fevers while at home.  HPI  Past Medical History  Diagnosis Date  . Arthritis   . Partial small bowel obstruction 04/23/13  . Lupus     Skin involvement only  . Pneumonia     childhood  . Cancer     Past Surgical History  Procedure Laterality Date  . Back surgery      Lumbar- one spurs  . Neck surgery      Cervical  . Stabbed and had abdominal surgery    . Video assisted thoracoscopy (vats)/wedge resection Left 08/09/2013    Procedure: VIDEO ASSISTED THORACOSCOPY (VATS)/WEDGE RESECTION AND LEFT LOWER LOBECTOMY;  Surgeon: Melrose Nakayama, MD;  Location: [facility] OR;  Service: Thoracic;  Laterality: Left;    Family History  Problem Relation Age of Onset  . Brain cancer Father   . Cerebral aneurysm Mother   . Lupus Sister     Social History History  Substance Use Topics  . Smoking status: Current Some Day Smoker -- 0.10 packs/day for 48 years    Types: Cigarettes  . Smokeless tobacco: Not on file  . Alcohol Use: 3.6 oz/week    6 Cans of beer per week     Comment: + 1 fifth a week    No Known Allergies  Current Outpatient Prescriptions  Medication Sig Dispense Refill  . amLODipine (NORVASC) 5 MG tablet Take 1 tablet (5 mg total) by mouth daily.  30 tablet  1  . metoprolol tartrate (LOPRESSOR) 12.5 mg TABS tablet Take 0.5 tablets (12.5 mg total) by mouth 2 (two) times daily.  60 each  1  . oxyCODONE-acetaminophen (PERCOCET/ROXICET) 5-325 MG per tablet Take 1-2 tablets by mouth every 4 (four) hours as needed for severe pain.  50 tablet  0   No current facility-administered medications for this visit.     Review of Systems Review of Systems  Constitutional: Negative.   HENT: Negative.   Eyes: Negative.   Respiratory: Negative.   Cardiovascular: Negative.   Gastrointestinal: Negative.   Endocrine: Negative.   Neurological: Negative.     Blood pressure 128/81, pulse 74, temperature 98.2 F (36.8 C), temperature source Temporal, resp. rate 18, height 5\' 7"  (1.702 m), weight 155 lb 6.4 oz (70.489 kg).  Physical Exam Physical Exam  Constitutional: He is oriented to person, place, and time. He appears well-developed and well-nourished.  HENT:  Head: Normocephalic and atraumatic.  Eyes: Conjunctivae and EOM are normal. Pupils are equal, round, and reactive to light.  Neck: Normal range of motion. Neck supple.    Small approximately 1 cm epidermal inclusion cyst, infected sebaceous cyst in the midline  Cardiovascular: Normal rate, regular rhythm and normal heart sounds.   Pulmonary/Chest: Effort normal and breath sounds normal.  Musculoskeletal: Normal range of motion.  Neurological: He is alert and oriented to person, place, and time.  Skin: Skin is warm and dry.    Data Reviewed none  Procedure: After the area was prepped and draped in the usual sterile fashion the limb blade was used to make an incision over  the area of greatest fluctuance. A proximal passing area of skin was then ellipsed off. The area was packed with quarter inch iodoform gauze. The patient tolerated the procedure well  Assessment    63 year old male with an infected sebaceous cyst     Plan    1 L. Give the patient aprescription for Bactrim for 5 days. 2. The patient has prescription for Percocet which I encouraged him to use it for the next 1-2 days thereafter he can use Tylenol.        Rosario Jacks., Dinh Ayotte 08/25/2013, 2:36 PM

## 2013-08-26 ENCOUNTER — Other Ambulatory Visit: Payer: Self-pay | Admitting: *Deleted

## 2013-08-26 DIAGNOSIS — R918 Other nonspecific abnormal finding of lung field: Secondary | ICD-10-CM

## 2013-08-31 ENCOUNTER — Ambulatory Visit (INDEPENDENT_AMBULATORY_CARE_PROVIDER_SITE_OTHER): Payer: Self-pay | Admitting: Thoracic Surgery (Cardiothoracic Vascular Surgery)

## 2013-08-31 ENCOUNTER — Encounter: Payer: Self-pay | Admitting: Thoracic Surgery (Cardiothoracic Vascular Surgery)

## 2013-08-31 ENCOUNTER — Ambulatory Visit
Admission: RE | Admit: 2013-08-31 | Discharge: 2013-08-31 | Disposition: A | Discharge status: Home / Self Care | Payer: Medicaid Other | Source: Ambulatory Visit | Attending: Thoracic Surgery (Cardiothoracic Vascular Surgery) | Admitting: Thoracic Surgery (Cardiothoracic Vascular Surgery)

## 2013-08-31 VITALS — BP 111/75 | HR 58 | Resp 20 | Ht 67.0 in | Wt 155.0 lb

## 2013-08-31 DIAGNOSIS — C3492 Malignant neoplasm of unspecified part of left bronchus or lung: Secondary | ICD-10-CM

## 2013-08-31 DIAGNOSIS — R918 Other nonspecific abnormal finding of lung field: Secondary | ICD-10-CM

## 2013-08-31 DIAGNOSIS — R222 Localized swelling, mass and lump, trunk: Secondary | ICD-10-CM

## 2013-08-31 DIAGNOSIS — Z09 Encounter for follow-up examination after completed treatment for conditions other than malignant neoplasm: Secondary | ICD-10-CM

## 2013-08-31 DIAGNOSIS — C3490 Malignant neoplasm of unspecified part of unspecified bronchus or lung: Secondary | ICD-10-CM

## 2013-08-31 MED ORDER — OXYCODONE-ACETAMINOPHEN 5-325 MG PO TABS: 1.0000 | ORAL_TABLET | ORAL | Status: DC | PRN

## 2013-08-31 NOTE — Progress Notes (Signed)
  HPI:  [name] is a 63 year old gentleman who underwent a left lower lobe basilar segmentectomy on January 19. He turned out to have a stage Ib (T2 N0) non-small cell carcinoma. This was a small tumor but didn't invade the visceral pleura which is why it was a T2 lesion. His postoperative course was uncomplicated and he was discharged home on postoperative day #5.  He says he still has some incisional pain and is requesting an additional refill on his oxycodone. He's taking one of those a couple of times a day for discomfort. He's not had any problems with his breathing. He had an infected sebaceous cyst on his posterior neck that was incised and drained about a week ago. Overall he feels well. He does complain of by a rash on his left leg  Past Medical History  Diagnosis Date  . Arthritis   . Partial small bowel obstruction 04/23/13  . Lupus     Skin involvement only  . Pneumonia     childhood  . Cancer       Current Outpatient Prescriptions  Medication Sig Dispense Refill  . amLODipine (NORVASC) 5 MG tablet Take 1 tablet (5 mg total) by mouth daily.  30 tablet  1  . metoprolol tartrate (LOPRESSOR) 12.5 mg TABS tablet Take 0.5 tablets (12.5 mg total) by mouth 2 (two) times daily.  60 each  1  . oxyCODONE-acetaminophen (PERCOCET/ROXICET) 5-325 MG per tablet Take 1-2 tablets by mouth every 4 (four) hours as needed for severe pain.  30 tablet  0   No current facility-administered medications for this visit.    Physical Exam BP 111/75  Pulse 58  Resp 20  Ht 5\' 7"  (1.702 m)  Wt 155 lb (70.308 kg)  BMI 24.27 kg/m2  SpO27 26% 63 year old male in no acute distress Cardiac regular rate and rhythm normal S1-S2 Lungs clear with diminished but equal breath sounds bilaterally Incisions healing well   Diagnostic Tests: CHEST 2 VIEW  COMPARISON: 08/14/2013 and earlier.  FINDINGS:  Stable lung volumes. Decreased but not resolved platelike  atelectasis or scarring at both lung  bases. Improved left lower lobe  ventilation. No pneumothorax. Small residual left pleural effusion  or pleural scarring. No pulmonary edema. No areas of worsening  ventilation. Stable cardiac size and mediastinal contours.  Visualized tracheal air column is within normal limits. Stable  visualized osseous structures.  IMPRESSION:  Improved lung base ventilation. Small residual left pleural effusion  versus pleural scarring. No new cardiopulmonary abnormality.  Electronically Signed  By: Lars Pinks M.D.  On: 08/31/2013 11:39   Impression: 63 year old gentleman who now is 3 weeks post left lower lobe basilar segmentectomy for a stage IB non-small cell carcinoma. There is no indication for adjuvant therapy at the current time. He is recovering well from a surgical standpoint. At this point his activities are unrestricted. He may drive, but should not drive while taking oxycodone.  He will see Dr. Julien Nordmann in 3 months. I would normally see him about that time but to avoid duplication or plan on seeing him in 6 months we'll do a CT of the chest at that time.  Plan:  return in 6 months with CT of chest

## 2013-11-23 ENCOUNTER — Other Ambulatory Visit (HOSPITAL_BASED_OUTPATIENT_CLINIC_OR_DEPARTMENT_OTHER): Payer: Medicaid Other

## 2013-11-23 ENCOUNTER — Telehealth: Payer: Self-pay | Admitting: *Deleted

## 2013-11-23 ENCOUNTER — Other Ambulatory Visit: Payer: Self-pay | Admitting: *Deleted

## 2013-11-23 ENCOUNTER — Encounter (HOSPITAL_COMMUNITY): Payer: Self-pay

## 2013-11-23 ENCOUNTER — Ambulatory Visit (HOSPITAL_COMMUNITY)
Admission: RE | Admit: 2013-11-23 | Discharge: 2013-11-23 | Disposition: A | Discharge status: Home / Self Care | Payer: Medicaid Other | Source: Ambulatory Visit | Attending: Internal Medicine | Admitting: Internal Medicine

## 2013-11-23 ENCOUNTER — Telehealth: Payer: Self-pay | Admitting: Internal Medicine

## 2013-11-23 DIAGNOSIS — C3490 Malignant neoplasm of unspecified part of unspecified bronchus or lung: Secondary | ICD-10-CM | POA: Diagnosis present

## 2013-11-23 DIAGNOSIS — R918 Other nonspecific abnormal finding of lung field: Secondary | ICD-10-CM

## 2013-11-23 DIAGNOSIS — Z902 Acquired absence of lung [part of]: Secondary | ICD-10-CM | POA: Diagnosis not present

## 2013-11-23 DIAGNOSIS — D709 Neutropenia, unspecified: Secondary | ICD-10-CM | POA: Insufficient documentation

## 2013-11-23 LAB — CBC WITH DIFFERENTIAL/PLATELET
BASO%: 0.7 % (ref 0.0–2.0)
Basophils Absolute: 0 10*3/uL (ref 0.0–0.1)
EOS ABS: 0.1 10*3/uL (ref 0.0–0.5)
EOS%: 2.3 % (ref 0.0–7.0)
HCT: 41.2 % (ref 38.4–49.9)
HGB: 13.3 g/dL (ref 13.0–17.1)
LYMPH%: 71.4 % — AB (ref 14.0–49.0)
MCH: 31.6 pg (ref 27.2–33.4)
MCHC: 32.4 g/dL (ref 32.0–36.0)
MCV: 97.4 fL (ref 79.3–98.0)
MONO#: 0.4 10*3/uL (ref 0.1–0.9)
MONO%: 12.9 % (ref 0.0–14.0)
NEUT%: 12.7 % — ABNORMAL LOW (ref 39.0–75.0)
NEUTROS ABS: 0.4 10*3/uL — AB (ref 1.5–6.5)
Platelets: 115 10*3/uL — ABNORMAL LOW (ref 140–400)
RBC: 4.23 10*6/uL (ref 4.20–5.82)
RDW: 16.5 % — ABNORMAL HIGH (ref 11.0–14.6)
WBC: 3.1 10*3/uL — ABNORMAL LOW (ref 4.0–10.3)
lymph#: 2.2 10*3/uL (ref 0.9–3.3)

## 2013-11-23 LAB — COMPREHENSIVE METABOLIC PANEL (CC13)
ALT: 53 U/L (ref 0–55)
AST: 124 U/L — AB (ref 5–34)
Albumin: 3.8 g/dL (ref 3.5–5.0)
Alkaline Phosphatase: 54 U/L (ref 40–150)
Anion Gap: 11 mEq/L (ref 3–11)
BUN: 4.9 mg/dL — ABNORMAL LOW (ref 7.0–26.0)
CO2: 30 mEq/L — ABNORMAL HIGH (ref 22–29)
Calcium: 9.1 mg/dL (ref 8.4–10.4)
Chloride: 104 mEq/L (ref 98–109)
Creatinine: 0.8 mg/dL (ref 0.7–1.3)
GLUCOSE: 105 mg/dL (ref 70–140)
Potassium: 4.1 mEq/L (ref 3.5–5.1)
SODIUM: 145 meq/L (ref 136–145)
TOTAL PROTEIN: 7.7 g/dL (ref 6.4–8.3)
Total Bilirubin: 0.3 mg/dL (ref 0.20–1.20)

## 2013-11-23 MED ORDER — IOHEXOL 300 MG/ML  SOLN
80.0000 mL | Freq: Once | INTRAMUSCULAR | Status: AC | PRN
  Administered 2013-11-23: 80 mL via INTRAVENOUS

## 2013-11-23 NOTE — Telephone Encounter (Signed)
Per Dr Julien Nordmann, called patient and gave Neutropenic precaution instructions. We will continue to monitor and recheck when he comes at next visit later this month. Orders to scheduling.

## 2013-11-23 NOTE — Progress Notes (Signed)
Quick Note:  Call patient with the result and provide neutropenic precautions. ______

## 2013-11-23 NOTE — Telephone Encounter (Signed)
per pt rqst r/s from 5/11 50 5/28

## 2013-11-29 ENCOUNTER — Ambulatory Visit: Payer: Medicaid Other | Admitting: Internal Medicine

## 2013-12-16 ENCOUNTER — Encounter: Payer: Self-pay | Admitting: Internal Medicine

## 2013-12-16 ENCOUNTER — Ambulatory Visit (HOSPITAL_BASED_OUTPATIENT_CLINIC_OR_DEPARTMENT_OTHER): Payer: Medicaid Other | Admitting: Internal Medicine

## 2013-12-16 VITALS — BP 134/98 | HR 51 | Temp 98.9°F | Resp 18 | Ht 67.0 in | Wt 147.0 lb

## 2013-12-16 DIAGNOSIS — R911 Solitary pulmonary nodule: Secondary | ICD-10-CM

## 2013-12-16 DIAGNOSIS — C3490 Malignant neoplasm of unspecified part of unspecified bronchus or lung: Secondary | ICD-10-CM

## 2013-12-16 DIAGNOSIS — C3430 Malignant neoplasm of lower lobe, unspecified bronchus or lung: Secondary | ICD-10-CM

## 2013-12-16 NOTE — Patient Instructions (Signed)
Smoking Cessation, Tips for Success If you are ready to quit smoking, congratulations! You have chosen to help yourself be healthier. Cigarettes bring nicotine, tar, carbon monoxide, and other irritants into your body. Your lungs, heart, and blood vessels will be able to work better without these poisons. There are many different ways to quit smoking. Nicotine gum, nicotine patches, a nicotine inhaler, or nicotine nasal spray can help with physical craving. Hypnosis, support groups, and medicines help break the habit of smoking. WHAT THINGS CAN I DO TO MAKE QUITTING EASIER?  Here are some tips to help you quit for good:  Pick a date when you will quit smoking completely. Tell all of your friends and family about your plan to quit on that date.  Do not try to slowly cut down on the number of cigarettes you are smoking. Pick a quit date and quit smoking completely starting on that day.  Throw away all cigarettes.   Clean and remove all ashtrays from your home, work, and car.   On a card, write down your reasons for quitting. Carry the card with you and read it when you get the urge to smoke.   Cleanse your body of nicotine. Drink enough water and fluids to keep your urine clear or pale yellow. Do this after quitting to flush the nicotine from your body.   Learn to predict your moods. Do not let a bad situation be your excuse to have a cigarette. Some situations in your life might tempt you into wanting a cigarette.   Never have "just one" cigarette. It leads to wanting another and another. Remind yourself of your decision to quit.   Change habits associated with smoking. If you smoked while driving or when feeling stressed, try other activities to replace smoking. Stand up when drinking your coffee. Brush your teeth after eating. Sit in a different chair when you read the paper. Avoid alcohol while trying to quit, and try to drink fewer caffeinated beverages. Alcohol and caffeine may urge  you to smoke.   Avoid foods and drinks that can trigger a desire to smoke, such as sugary or spicy foods and alcohol.   Ask people who smoke not to smoke around you.   Have something planned to do right after eating or having a cup of coffee. For example, plan to take a walk or exercise.   Try a relaxation exercise to calm you down and decrease your stress. Remember, you may be tense and nervous for the first 2 weeks after you quit, but this will pass.   Find new activities to keep your hands busy. Play with a pen, coin, or rubber band. Doodle or draw things on paper.   Brush your teeth right after eating. This will help cut down on the craving for the taste of tobacco after meals. You can also try mouthwash.   Use oral substitutes in place of cigarettes. Try using lemon drops, carrots, cinnamon sticks, or chewing gum. Keep them handy so they are available when you have the urge to smoke.   When you have the urge to smoke, try deep breathing.   Designate your home as a nonsmoking area.   If you are a heavy smoker, ask your health care provider about a prescription for nicotine chewing gum. It can ease your withdrawal from nicotine.   Reward yourself. Set aside the cigarette money you save and buy yourself something nice.   Look for support from others. Join a support group or   smoking cessation program. Ask someone at home or at work to help you with your plan to quit smoking.   Always ask yourself, "Do I need this cigarette or is this just a reflex?" Tell yourself, "Today, I choose not to smoke," or "I do not want to smoke." You are reminding yourself of your decision to quit.  Do not replace cigarette smoking with electronic cigarettes (commonly called e-cigarettes). The safety of e-cigarettes is unknown, and some may contain harmful chemicals.  If you relapse, do not give up! Plan ahead and think about what you will do the next time you get the urge to smoke.  HOW WILL  I FEEL WHEN I QUIT SMOKING? You may have symptoms of withdrawal because your body is used to nicotine (the addictive substance in cigarettes). You may crave cigarettes, be irritable, feel very hungry, cough often, get headaches, or have difficulty concentrating. The withdrawal symptoms are only temporary. They are strongest when you first quit but will go away within 10 14 days. When withdrawal symptoms occur, stay in control. Think about your reasons for quitting. Remind yourself that these are signs that your body is healing and getting used to being without cigarettes. Remember that withdrawal symptoms are easier to treat than the major diseases that smoking can cause.  Even after the withdrawal is over, expect periodic urges to smoke. However, these cravings are generally short lived and will go away whether you smoke or not. Do not smoke!  WHAT RESOURCES ARE AVAILABLE TO HELP ME QUIT SMOKING? Your health care provider can direct you to community resources or hospitals for support, which may include:  Group support.  Education.  Hypnosis.  Therapy. Document Released: 04/05/2004 Document Revised: 04/28/2013 Document Reviewed: 12/24/2012 [employer] Patient Information 2014 [employer].  

## 2013-12-16 NOTE — Progress Notes (Signed)
[facility] [phone]   Fax:([phone]  OFFICE PROGRESS NOTE  PROVIDER NOT IN SYSTEM No address on file  DIAGNOSIS: Stage IB (T2a, N0, M0) non-small cell lung cancer, adenocarcinoma diagnosed in January of 2015  PRIOR THERAPY: Status post left video-assisted thoracoscopy, wedge resection left lower lobe nodule, thoracoscopic left lower lobe basilar segmentectomy, mediastinal lymph node dissection under the care of Dr. Roxan Hockey on 08/09/2013  CURRENT THERAPY: Observation  INTERVAL HISTORY: Aaron Conway 63 y.o. male returns to the clinic today for followup visit. The patient is feeling fine today with no specific complaints except for soreness at the left side of the chest after the surgical resection. He denied having any significant shortness of breath, cough or hemoptysis. He denied having any significant weight loss or night sweats. He has no fever or chills. He has no nausea or vomiting. He has mild rash on the left leg started a month ago. He had repeat CT scan of the chest performed recently and he is here for evaluation and discussion of his scan results.  MEDICAL HISTORY: Past Medical History  Diagnosis Date  . Arthritis   . Partial small bowel obstruction 04/23/13  . Lupus     Skin involvement only  . Pneumonia     childhood  . Cancer     ALLERGIES:  has No Known Allergies.  MEDICATIONS:  Current Outpatient Prescriptions  Medication Sig Dispense Refill  . amLODipine (NORVASC) 5 MG tablet Take 1 tablet (5 mg total) by mouth daily.  30 tablet  1  . metoprolol tartrate (LOPRESSOR) 12.5 mg TABS tablet Take 0.5 tablets (12.5 mg total) by mouth 2 (two) times daily.  60 each  1  . oxyCODONE-acetaminophen (PERCOCET/ROXICET) 5-325 MG per tablet Take 1-2 tablets by mouth every 4 (four) hours as needed for severe pain.  30 tablet  0   No current facility-administered medications for this visit.    SURGICAL HISTORY:  Past Surgical  History  Procedure Laterality Date  . Back surgery      Lumbar- one spurs  . Neck surgery      Cervical  . Stabbed and had abdominal surgery    . Video assisted thoracoscopy (vats)/wedge resection Left 08/09/2013    Procedure: VIDEO ASSISTED THORACOSCOPY (VATS)/WEDGE RESECTION AND LEFT LOWER LOBECTOMY;  Surgeon: Melrose Nakayama, MD;  Location: [facility];  Service: Thoracic;  Laterality: Left;    REVIEW OF SYSTEMS:  Constitutional: negative Eyes: negative Ears, nose, mouth, throat, and face: negative Respiratory: positive for pleurisy/chest pain Cardiovascular: negative Gastrointestinal: negative Genitourinary:negative Integument/breast: negative Hematologic/lymphatic: negative Musculoskeletal:negative Neurological: negative Behavioral/Psych: negative Endocrine: negative Allergic/Immunologic: negative   PHYSICAL EXAMINATION: General appearance: alert, cooperative and no distress Head: Normocephalic, without obvious abnormality, atraumatic Neck: no adenopathy, no JVD, supple, symmetrical, trachea midline and thyroid not enlarged, symmetric, no tenderness/mass/nodules Lymph nodes: Cervical, supraclavicular, and axillary nodes normal. Resp: clear to auscultation bilaterally Back: symmetric, no curvature. ROM normal. No CVA tenderness. Cardio: regular rate and rhythm, S1, S2 normal, no murmur, click, rub or gallop GI: soft, non-tender; bowel sounds normal; no masses,  no organomegaly Extremities: extremities normal, atraumatic, no cyanosis or edema Neurologic: Alert and oriented X 3, normal strength and tone. Normal symmetric reflexes. Normal coordination and gait  ECOG PERFORMANCE STATUS: 1 - Symptomatic but completely ambulatory  Blood pressure 134/98, pulse 51, temperature 98.9 F (37.2 C), temperature source Oral, resp. rate 18, height 5\' 7"  (1.702 m), weight 147 lb (66.679 kg).  LABORATORY  DATA: Lab Results  Component Value Date   WBC 3.1* 11/23/2013   HGB 13.3 11/23/2013    HCT 41.2 11/23/2013   MCV 97.4 11/23/2013   PLT 115* 11/23/2013      Chemistry      Component Value Date/Time   NA 145 11/23/2013 0835   NA 136* 08/12/2013 0225   K 4.1 11/23/2013 0835   K 3.9 08/12/2013 0225   CL 98 08/12/2013 0225   CO2 30* 11/23/2013 0835   CO2 27 08/12/2013 0225   BUN 4.9* 11/23/2013 0835   BUN 5* 08/12/2013 0225   CREATININE 0.8 11/23/2013 0835   CREATININE 0.61 08/12/2013 0225      Component Value Date/Time   CALCIUM 9.1 11/23/2013 0835   CALCIUM 8.7 08/12/2013 0225   ALKPHOS 54 11/23/2013 0835   ALKPHOS 37* 08/11/2013 0332   AST 124* 11/23/2013 0835   AST 25 08/11/2013 0332   ALT 53 11/23/2013 0835   ALT 12 08/11/2013 0332   BILITOT 0.30 11/23/2013 0835   BILITOT 0.3 08/11/2013 0332       RADIOGRAPHIC STUDIES: Ct Chest W Contrast  11/23/2013   CLINICAL DATA:  Followup lung cancer  EXAM: CT CHEST WITH CONTRAST  TECHNIQUE: Multidetector CT imaging of the chest was performed during intravenous contrast administration.  CONTRAST:  23mL OMNIPAQUE IOHEXOL 300 MG/ML  SOLN  COMPARISON:  PET-CT 07/16/2013  FINDINGS: There is no pleural effusion identified. Advanced changes of bolus emphysema identified. Scarring identified within the right lower lobe and right middle lobe. Status post left lower lobectomy. Pulmonary nodule in the right upper lobe measures 8 mm, image 27/series 7. This is unchanged from previous exam. No new or enlarging pulmonary nodules or mass is identified.  The heart size appears normal. No pericardial effusion identified. Calcified atherosclerotic disease involves the aortic arch and RCA Coronary artery. No pathologically enlarged mediastinal lymph nodes identified. No hilar adenopathy present. There is no axillary or supraclavicular adenopathy.  Incidental imaging through the upper abdomen shows no acute findings. The adrenal glands are both normal.  Review of the visualized bony structures is unremarkable. No aggressive lytic or sclerotic bone lesions identified. Stable chronic  right fourth and fifth rib fractures. No aggressive lytic or sclerotic bone lesions.  IMPRESSION: 1. No acute cardiopulmonary abnormalities. 2. Stable right upper lobe pulmonary nodule which remains concerning for adenocarcinoma. 3. No complications after left lower lobectomy. 4. Atherosclerotic disease.   Electronically Signed   By: Kerby Moors M.D.   On: 11/23/2013 11:30   ASSESSMENT AND PLAN: This is a very pleasant 63 years old [place] American male recently diagnosed with a stage IB (T2a, N0, M0) non-small cell lung cancer, adenocarcinoma involving the left lower lobe status post wedge resection with lymph node dissection. The tumor involves the visceral pleura. The patient also has a suspicious small pulmonary nodule in the right upper lobe concerning for synchronous primary lung cancer. His recent scan showed no evidence for disease progression. I discussed the scan results with the patient and recommended for him to continue on observation with repeat CT scan of the chest in 6 months. He was advised to call immediately if he has any concerning symptoms in the interval. He was also advised to apply over-the-counter hydrocortisone cream to the skin rash area. All questions were answered. The patient knows to call the clinic with any problems, questions or concerns. We can certainly see the patient much sooner if necessary.   Disclaimer: This note was dictated with voice  recognition software. Similar sounding words can inadvertently be transcribed and may not be corrected upon review.

## 2013-12-17 ENCOUNTER — Telehealth: Payer: Self-pay | Admitting: Internal Medicine

## 2013-12-17 NOTE — Telephone Encounter (Signed)
lvm for pt regading to Nov and DEc appt....mailed pt appt sched/avs and letter

## 2014-02-07 ENCOUNTER — Other Ambulatory Visit: Payer: Self-pay

## 2014-02-07 DIAGNOSIS — C3490 Malignant neoplasm of unspecified part of unspecified bronchus or lung: Secondary | ICD-10-CM

## 2014-03-22 ENCOUNTER — Ambulatory Visit (INDEPENDENT_AMBULATORY_CARE_PROVIDER_SITE_OTHER): Payer: Medicaid Other | Admitting: Thoracic Surgery (Cardiothoracic Vascular Surgery)

## 2014-03-22 ENCOUNTER — Encounter: Payer: Self-pay | Admitting: Thoracic Surgery (Cardiothoracic Vascular Surgery)

## 2014-03-22 ENCOUNTER — Other Ambulatory Visit: Payer: Medicaid Other

## 2014-03-22 VITALS — BP 131/83 | HR 63 | Ht 67.0 in | Wt 148.0 lb

## 2014-03-22 DIAGNOSIS — C3490 Malignant neoplasm of unspecified part of unspecified bronchus or lung: Secondary | ICD-10-CM

## 2014-03-22 DIAGNOSIS — C3492 Malignant neoplasm of unspecified part of left bronchus or lung: Secondary | ICD-10-CM

## 2014-03-22 NOTE — Progress Notes (Signed)
  HPI:  [name] returns today for a scheduled followup visit. He is a 63 year old man who underwent a left lower lobe basilar segmentectomy in January of 2015 for a stage IB non-small cell carcinoma. He has a known 8 mm right upper lobe nodule that is being followed as well. He had a CT scan in May and saw Dr. Julien Nordmann at that time. His right upper lobe nodule was stable. He had no evidence of recurrent disease.  He says that he's been feeling well. His weight has been stable. He has not had any cough or hemoptysis. He is not having any issues with his breathing. He is still smoking an occasional cigarette. He was scheduled to have a CT today but he has not been able to get approval from Medicare yet.  Past Medical History  Diagnosis Date  . Arthritis   . Partial small bowel obstruction 04/23/13  . Lupus     Skin involvement only  . Pneumonia     childhood  . Cancer   \  No current outpatient prescriptions on file.   No current facility-administered medications for this visit.    Physical Exam BP 131/83  Pulse 63  Ht 5\' 7"  (1.702 m)  Wt 148 lb (67.132 kg)  BMI 23.17 kg/m2  SpO2 23% Thin 63 year old man in no acute distress No cervical or suprapubic or adenopathy Lungs clear with equal breath sounds bilaterally Incisions well healed  Diagnostic Tests: No new studies today  Impression: 63 year old man who is now 6 months post basilar segmentectomy for a stage IB non-small cell carcinoma. He had a CT in May which showed no evidence recurrent disease and a stable 8 mm right upper lobe nodule.  Is once again encouraged to quit smoking.  He is scheduled to have a CT scan in December and see Mohamed at that time. We need to take close attention to the right upper lobe nodule on followup as that may need to be resected at some point as well.  Plan: Return in December after CT of chest

## 2014-06-15 ENCOUNTER — Ambulatory Visit (HOSPITAL_COMMUNITY): Payer: Medicaid Other

## 2014-06-15 ENCOUNTER — Other Ambulatory Visit: Payer: Medicaid Other

## 2014-06-21 ENCOUNTER — Ambulatory Visit (HOSPITAL_BASED_OUTPATIENT_CLINIC_OR_DEPARTMENT_OTHER): Payer: Medicare Other

## 2014-06-21 ENCOUNTER — Ambulatory Visit (HOSPITAL_COMMUNITY)
Admission: RE | Admit: 2014-06-21 | Discharge: 2014-06-21 | Disposition: A | Discharge status: Home / Self Care | Payer: Medicare Other | Source: Ambulatory Visit | Attending: Internal Medicine | Admitting: Internal Medicine

## 2014-06-21 DIAGNOSIS — R911 Solitary pulmonary nodule: Secondary | ICD-10-CM | POA: Diagnosis not present

## 2014-06-21 DIAGNOSIS — C3490 Malignant neoplasm of unspecified part of unspecified bronchus or lung: Secondary | ICD-10-CM | POA: Insufficient documentation

## 2014-06-21 DIAGNOSIS — J439 Emphysema, unspecified: Secondary | ICD-10-CM | POA: Diagnosis not present

## 2014-06-21 DIAGNOSIS — D709 Neutropenia, unspecified: Secondary | ICD-10-CM

## 2014-06-21 LAB — CBC WITH DIFFERENTIAL/PLATELET
BASO%: 0.3 % (ref 0.0–2.0)
Basophils Absolute: 0 10*3/uL (ref 0.0–0.1)
EOS ABS: 0.1 10*3/uL (ref 0.0–0.5)
EOS%: 1.6 % (ref 0.0–7.0)
HEMATOCRIT: 39.2 % (ref 38.4–49.9)
HGB: 13.6 g/dL (ref 13.0–17.1)
LYMPH%: 31.7 % (ref 14.0–49.0)
MCH: 32.6 pg (ref 27.2–33.4)
MCHC: 34.7 g/dL (ref 32.0–36.0)
MCV: 94 fL (ref 79.3–98.0)
MONO#: 0.3 10*3/uL (ref 0.1–0.9)
MONO%: 9 % (ref 0.0–14.0)
NEUT%: 57.4 % (ref 39.0–75.0)
NEUTROS ABS: 2.2 10*3/uL (ref 1.5–6.5)
NRBC: 0 % (ref 0–0)
PLATELETS: 125 10*3/uL — AB (ref 140–400)
RBC: 4.17 10*6/uL — AB (ref 4.20–5.82)
RDW: 14.8 % — ABNORMAL HIGH (ref 11.0–14.6)
WBC: 3.8 10*3/uL — AB (ref 4.0–10.3)
lymph#: 1.2 10*3/uL (ref 0.9–3.3)

## 2014-06-21 LAB — COMPREHENSIVE METABOLIC PANEL (CC13)
ALT: 16 U/L (ref 0–55)
AST: 26 U/L (ref 5–34)
Albumin: 4.1 g/dL (ref 3.5–5.0)
Alkaline Phosphatase: 50 U/L (ref 40–150)
Anion Gap: 10 mEq/L (ref 3–11)
BUN: 9.7 mg/dL (ref 7.0–26.0)
CO2: 26 mEq/L (ref 22–29)
Calcium: 9.6 mg/dL (ref 8.4–10.4)
Chloride: 101 mEq/L (ref 98–109)
Creatinine: 0.8 mg/dL (ref 0.7–1.3)
Glucose: 82 mg/dl (ref 70–140)
Potassium: 4 mEq/L (ref 3.5–5.1)
Sodium: 138 mEq/L (ref 136–145)
Total Bilirubin: 0.48 mg/dL (ref 0.20–1.20)
Total Protein: 8.3 g/dL (ref 6.4–8.3)

## 2014-06-21 MED ORDER — IOHEXOL 300 MG/ML  SOLN
80.0000 mL | Freq: Once | INTRAMUSCULAR | Status: AC | PRN
  Administered 2014-06-21: 80 mL via INTRAVENOUS

## 2014-06-22 ENCOUNTER — Encounter: Payer: Self-pay | Admitting: Internal Medicine

## 2014-06-22 ENCOUNTER — Ambulatory Visit (HOSPITAL_BASED_OUTPATIENT_CLINIC_OR_DEPARTMENT_OTHER): Payer: Medicare Other | Admitting: Internal Medicine

## 2014-06-22 ENCOUNTER — Telehealth: Payer: Self-pay | Admitting: Internal Medicine

## 2014-06-22 VITALS — BP 141/81 | HR 70 | Temp 98.6°F | Resp 18 | Ht 67.0 in | Wt 146.2 lb

## 2014-06-22 DIAGNOSIS — C3432 Malignant neoplasm of lower lobe, left bronchus or lung: Secondary | ICD-10-CM

## 2014-06-22 NOTE — Progress Notes (Signed)
[facility] Telephone:(336) [phone]   Fax:(336) [phone]  OFFICE PROGRESS NOTE  PROVIDER NOT IN SYSTEM No address on file  DIAGNOSIS: Stage IB (T2a, N0, M0) non-small cell lung cancer, adenocarcinoma diagnosed in January of 2015  PRIOR THERAPY: Status post left video-assisted thoracoscopy, wedge resection left lower lobe nodule, thoracoscopic left lower lobe basilar segmentectomy, mediastinal lymph node dissection under the care of Dr. Roxan Hockey on 08/09/2013  CURRENT THERAPY: Observation  INTERVAL HISTORY: Aaron Conway 63 y.o. male returns to the clinic today for 6 months followup visit. The patient is feeling fine today with no specific complaints. He denied having any significant chest pain, shortness of breath, cough or hemoptysis. He denied having any significant weight loss or night sweats. He has no fever or chills. He has no nausea or vomiting. He had repeat CT scan of the chest performed recently and he is here for evaluation and discussion of his scan results.  MEDICAL HISTORY: Past Medical History  Diagnosis Date  . Arthritis   . Partial small bowel obstruction 04/23/13  . Lupus     Skin involvement only  . Pneumonia     childhood  . Cancer     ALLERGIES:  has No Known Allergies.  MEDICATIONS:  No current outpatient prescriptions on file.   No current facility-administered medications for this visit.    SURGICAL HISTORY:  Past Surgical History  Procedure Laterality Date  . Back surgery      Lumbar- one spurs  . Neck surgery      Cervical  . Stabbed and had abdominal surgery    . Video assisted thoracoscopy (vats)/wedge resection Left 08/09/2013    Procedure: VIDEO ASSISTED THORACOSCOPY (VATS)/WEDGE RESECTION AND LEFT LOWER LOBECTOMY;  Surgeon: Melrose Nakayama, MD;  Location: [facility];  Service: Thoracic;  Laterality: Left;    REVIEW OF SYSTEMS:  Constitutional: negative Eyes: negative Ears, nose, mouth, throat, and face:  negative Respiratory: positive for pleurisy/chest pain Cardiovascular: negative Gastrointestinal: negative Genitourinary:negative Integument/breast: negative Hematologic/lymphatic: negative Musculoskeletal:negative Neurological: negative Behavioral/Psych: negative Endocrine: negative Allergic/Immunologic: negative   PHYSICAL EXAMINATION: General appearance: alert, cooperative and no distress Head: Normocephalic, without obvious abnormality, atraumatic Neck: no adenopathy, no JVD, supple, symmetrical, trachea midline and thyroid not enlarged, symmetric, no tenderness/mass/nodules Lymph nodes: Cervical, supraclavicular, and axillary nodes normal. Resp: clear to auscultation bilaterally Back: symmetric, no curvature. ROM normal. No CVA tenderness. Cardio: regular rate and rhythm, S1, S2 normal, no murmur, click, rub or gallop GI: soft, non-tender; bowel sounds normal; no masses,  no organomegaly Extremities: extremities normal, atraumatic, no cyanosis or edema Neurologic: Alert and oriented X 3, normal strength and tone. Normal symmetric reflexes. Normal coordination and gait  ECOG PERFORMANCE STATUS: 1 - Symptomatic but completely ambulatory  Blood pressure 141/81, pulse 70, temperature 98.6 F (37 C), temperature source Oral, resp. rate 18, height 5\' 7"  (1.702 m), weight 146 lb 3.2 oz (66.316 kg).  LABORATORY DATA: Lab Results  Component Value Date   WBC 3.8* 06/21/2014   HGB 13.6 06/21/2014   HCT 39.2 06/21/2014   MCV 94.0 06/21/2014   PLT 125* 06/21/2014      Chemistry      Component Value Date/Time   NA 138 06/21/2014 1457   NA 136* 08/12/2013 0225   K 4.0 06/21/2014 1457   K 3.9 08/12/2013 0225   CL 98 08/12/2013 0225   CO2 26 06/21/2014 1457   CO2 27 08/12/2013 0225   BUN 9.7 06/21/2014 1457  BUN 5* 08/12/2013 0225   CREATININE 0.8 06/21/2014 1457   CREATININE 0.61 08/12/2013 0225      Component Value Date/Time   CALCIUM 9.6 06/21/2014 1457   CALCIUM 8.7  08/12/2013 0225   ALKPHOS 50 06/21/2014 1457   ALKPHOS 37* 08/11/2013 0332   AST 26 06/21/2014 1457   AST 25 08/11/2013 0332   ALT 16 06/21/2014 1457   ALT 12 08/11/2013 0332   BILITOT 0.48 06/21/2014 1457   BILITOT 0.3 08/11/2013 0332       RADIOGRAPHIC STUDIES: Ct Chest W Contrast  06/21/2014   CLINICAL DATA:  Follow-up lung cancer. No interval therapy. Subsequent encounter.  EXAM: CT CHEST WITH CONTRAST  TECHNIQUE: Multidetector CT imaging of the chest was performed during intravenous contrast administration.  CONTRAST:  63mL OMNIPAQUE IOHEXOL 300 MG/ML  SOLN  COMPARISON:  Chest CT 11/23/2013 and 04/24/2013.  PET-CT 07/16/2013.  FINDINGS: Mediastinum: There are no enlarged mediastinal or hilar lymph nodes. Small precarinal and subcarinal lymph nodes are stable. A small nodular density along the left wall of the trachea at the thoracic inlet (image number 11) was not present previously and is probably a secretion. Tiny collection of air in the right tracheoesophageal groove on image number 16 is stable, possibly a small tracheal diverticulum. The thyroid gland and esophagus demonstrate no significant findings. The heart size is normal. There is no pericardial effusion.There is stable central enlargement of the pulmonary arteries and atherosclerosis of the aorta and great vessels.  Lungs/Pleura: There is no pleural effusion.There are stable postsurgical changes related to left lower lobe resection. Diffuse changes of centrilobular emphysema are present with biapical bullous changes. Sub solid 8 mm subpleural nodule in the right upper lobe on image number 31 is stable from the most recent study, although enlarged over the last 14 months. There is stable linear scarring in the right middle and lower lobes.  Upper abdomen:  Unremarkable.  There is no adrenal mass.  Musculoskeletal/Chest wall: There is no axillary adenopathy or chest wall mass. No suspicious osseous findings. Mild scoliosis noted.   IMPRESSION: 1. Slow growth of sub solid nodule in the right upper lobe, not substantially changed from most recent study of 7 months ago. This finding remains concerning for adenocarcinoma. 2. Stable postsurgical changes from previous left lower lobe resection. No evidence of metastatic disease. 3. Stable emphysema with biapical bullous changes. 4. Probable secretions within the proximal trachea.   Electronically Signed   By: Camie Patience M.D.   On: 06/21/2014 17:28    ASSESSMENT AND PLAN: This is a very pleasant 63 years old [place] American male recently diagnosed with a stage IB (T2a, N0, M0) non-small cell lung cancer, adenocarcinoma involving the left lower lobe status post wedge resection with lymph node dissection. The tumor involves the visceral pleura. The patient also has a suspicious small pulmonary nodule in the right upper lobe concerning for synchronous primary lung cancer. This nodule is a stable stable on the recent scan. His recent scan showed no evidence for disease progression. I discussed the scan results with the patient and recommended for him to continue on observation with repeat CT scan of the chest in 6 months. He was advised to call immediately if he has any concerning symptoms in the interval. All questions were answered. The patient knows to call the clinic with any problems, questions or concerns. We can certainly see the patient much sooner if necessary.   Disclaimer: This note was dictated with voice recognition software. Similar  sounding words can inadvertently be transcribed and may not be corrected upon review.

## 2014-06-22 NOTE — Telephone Encounter (Signed)
Pt confirmed labs/ov per 12/02 POF, gave pt AVS..... KJ

## 2014-06-22 NOTE — Patient Instructions (Signed)
Smoking Cessation, Tips for Success  If you are ready to quit smoking, congratulations! You have chosen to help yourself be healthier. Cigarettes bring nicotine, tar, carbon monoxide, and other irritants into your body. Your lungs, heart, and blood vessels will be able to work better without these poisons. There are many different ways to quit smoking. Nicotine gum, nicotine patches, a nicotine inhaler, or nicotine nasal spray can help with physical craving. Hypnosis, support groups, and medicines help break the habit of smoking.  WHAT THINGS CAN I DO TO MAKE QUITTING EASIER?   Here are some tips to help you quit for good:  · Pick a date when you will quit smoking completely. Tell all of your friends and family about your plan to quit on that date.  · Do not try to slowly cut down on the number of cigarettes you are smoking. Pick a quit date and quit smoking completely starting on that day.  · Throw away all cigarettes.    · Clean and remove all ashtrays from your home, work, and car.  · On a card, write down your reasons for quitting. Carry the card with you and read it when you get the urge to smoke.  · Cleanse your body of nicotine. Drink enough water and fluids to keep your urine clear or pale yellow. Do this after quitting to flush the nicotine from your body.  · Learn to predict your moods. Do not let a bad situation be your excuse to have a cigarette. Some situations in your life might tempt you into wanting a cigarette.  · Never have "just one" cigarette. It leads to wanting another and another. Remind yourself of your decision to quit.  · Change habits associated with smoking. If you smoked while driving or when feeling stressed, try other activities to replace smoking. Stand up when drinking your coffee. Brush your teeth after eating. Sit in a different chair when you read the paper. Avoid alcohol while trying to quit, and try to drink fewer caffeinated beverages. Alcohol and caffeine may urge you to  smoke.  · Avoid foods and drinks that can trigger a desire to smoke, such as sugary or spicy foods and alcohol.  · Ask people who smoke not to smoke around you.  · Have something planned to do right after eating or having a cup of coffee. For example, plan to take a walk or exercise.  · Try a relaxation exercise to calm you down and decrease your stress. Remember, you may be tense and nervous for the first 2 weeks after you quit, but this will pass.  · Find new activities to keep your hands busy. Play with a pen, coin, or rubber band. Doodle or draw things on paper.  · Brush your teeth right after eating. This will help cut down on the craving for the taste of tobacco after meals. You can also try mouthwash.    · Use oral substitutes in place of cigarettes. Try using lemon drops, carrots, cinnamon sticks, or chewing gum. Keep them handy so they are available when you have the urge to smoke.  · When you have the urge to smoke, try deep breathing.  · Designate your home as a nonsmoking area.  · If you are a heavy smoker, ask your health care provider about a prescription for nicotine chewing gum. It can ease your withdrawal from nicotine.  · Reward yourself. Set aside the cigarette money you save and buy yourself something nice.  · Look for   support from others. Join a support group or smoking cessation program. Ask someone at home or at work to help you with your plan to quit smoking.  · Always ask yourself, "Do I need this cigarette or is this just a reflex?" Tell yourself, "Today, I choose not to smoke," or "I do not want to smoke." You are reminding yourself of your decision to quit.  · Do not replace cigarette smoking with electronic cigarettes (commonly called e-cigarettes). The safety of e-cigarettes is unknown, and some may contain harmful chemicals.  · If you relapse, do not give up! Plan ahead and think about what you will do the next time you get the urge to smoke.  HOW WILL I FEEL WHEN I QUIT SMOKING?  You  may have symptoms of withdrawal because your body is used to nicotine (the addictive substance in cigarettes). You may crave cigarettes, be irritable, feel very hungry, cough often, get headaches, or have difficulty concentrating. The withdrawal symptoms are only temporary. They are strongest when you first quit but will go away within 10-14 days. When withdrawal symptoms occur, stay in control. Think about your reasons for quitting. Remind yourself that these are signs that your body is healing and getting used to being without cigarettes. Remember that withdrawal symptoms are easier to treat than the major diseases that smoking can cause.   Even after the withdrawal is over, expect periodic urges to smoke. However, these cravings are generally short lived and will go away whether you smoke or not. Do not smoke!  WHAT RESOURCES ARE AVAILABLE TO HELP ME QUIT SMOKING?  Your health care provider can direct you to community resources or hospitals for support, which may include:  · Group support.  · Education.  · Hypnosis.  · Therapy.  Document Released: 04/05/2004 Document Revised: 11/22/2013 Document Reviewed: 12/24/2012  [employer]® Patient Information ©2015 [employer]. This information is not intended to replace advice given to you by your health care provider. Make sure you discuss any questions you have with your health care provider.

## 2014-07-05 ENCOUNTER — Encounter: Payer: Self-pay | Admitting: Thoracic Surgery (Cardiothoracic Vascular Surgery)

## 2014-07-05 ENCOUNTER — Ambulatory Visit (INDEPENDENT_AMBULATORY_CARE_PROVIDER_SITE_OTHER): Payer: Medicare Other | Admitting: Thoracic Surgery (Cardiothoracic Vascular Surgery)

## 2014-07-05 VITALS — BP 127/74 | HR 72 | Resp 20 | Ht 67.0 in | Wt 145.0 lb

## 2014-07-05 DIAGNOSIS — C3492 Malignant neoplasm of unspecified part of left bronchus or lung: Secondary | ICD-10-CM

## 2014-07-05 DIAGNOSIS — R911 Solitary pulmonary nodule: Secondary | ICD-10-CM

## 2014-07-05 NOTE — Progress Notes (Signed)
HPI:  [name] returns today for a scheduled followup visit.   He is a 63 year old man who underwent a left lower lobe basilar segmentectomy in January of 2015 for a stage IB non-small cell carcinoma. He saw Dr. Julien Conway and no chemotherapy was indicated.  He has a known 8 mm right upper lobe nodule that is being followed as well.   He saw Dr. Julien Conway 2 weeks ago. His right upper lobe nodule was stable. He had no evidence of recurrent disease.  He has not had any issues with his breathing. He says he still smoking and occasional cigarette. He was once again counseled to stop completely. He has not noted any weight loss. He's not had any unusual headaches or visual changes.  He does have some complaints with urinary frequency and some difficulty maintaining erection.  Past Medical History  Diagnosis Date  . Arthritis   . Partial small bowel obstruction 04/23/13  . Lupus     Skin involvement only  . Pneumonia     childhood  . Cancer       No current outpatient prescriptions on file.   No current facility-administered medications for this visit.    Physical Exam BP 127/74 mmHg  Pulse 72  Resp 20  Ht 5\' 7"  (1.702 m)  Wt 145 lb (65.772 kg)  BMI 22.71 kg/m2  SpO2 63% 63 year old man in no acute distress Alert and oriented 3 with no focal neurologic deficit No cervical or supraclavicular adenopathy Lungs clear with equal breath sounds bilaterally Incisions well healed Cardiac regular rate and rhythm normal S1 and S2  Diagnostic Tests: CT CHEST WITH CONTRAST  TECHNIQUE: Multidetector CT imaging of the chest was performed during intravenous contrast administration.  CONTRAST: 3mL OMNIPAQUE IOHEXOL 300 MG/ML SOLN  COMPARISON: Chest CT 11/23/2013 and 04/24/2013. PET-CT 07/16/2013.  FINDINGS: Mediastinum: There are no enlarged mediastinal or hilar lymph nodes. Small precarinal and subcarinal lymph nodes are stable. A small nodular density along the left  wall of the trachea at the thoracic inlet (image number 11) was not present previously and is probably a secretion. Tiny collection of air in the right tracheoesophageal groove on image number 16 is stable, possibly a small tracheal diverticulum. The thyroid gland and esophagus demonstrate no significant findings. The heart size is normal. There is no pericardial effusion.There is stable central enlargement of the pulmonary arteries and atherosclerosis of the aorta and great vessels.  Lungs/Pleura: There is no pleural effusion.There are stable postsurgical changes related to left lower lobe resection. Diffuse changes of centrilobular emphysema are present with biapical bullous changes. Sub solid 8 mm subpleural nodule in the right upper lobe on image number 31 is stable from the most recent study, although enlarged over the last 14 months. There is stable linear scarring in the right middle and lower lobes.  Upper abdomen: Unremarkable. There is no adrenal mass.  Musculoskeletal/Chest wall: There is no axillary adenopathy or chest wall mass. No suspicious osseous findings. Mild scoliosis noted.  IMPRESSION: 1. Slow growth of sub solid nodule in the right upper lobe, not substantially changed from most recent study of 7 months ago. This finding remains concerning for adenocarcinoma. 2. Stable postsurgical changes from previous left lower lobe resection. No evidence of metastatic disease. 3. Stable emphysema with biapical bullous changes. 4. Probable secretions within the proximal trachea.   Electronically Signed  By: Aaron Conway M.D.  On: 06/21/2014 17:28   Impression: 63 year old gentleman who is now almost a year out from a thoracoscopic  left lower lobe basilar segmentectomy for a stage IB non-small cell carcinoma. He has no evidence recurrent disease.  He does have an 8 mm nodule in the right lung. This is unchanged over the past 7 months. Given that there is  been no interval change we will continue to follow that nodule.  He is scheduled to have another CT scan in 6 months.  I will see him back after that scan.  He does not have a primary physician since moving to [place]. I am going to refer him to urology for assessment of his urinary frequency and erectile dysfunction.

## 2014-08-17 DIAGNOSIS — F524 Premature ejaculation: Secondary | ICD-10-CM | POA: Diagnosis not present

## 2014-08-17 DIAGNOSIS — R3915 Urgency of urination: Secondary | ICD-10-CM | POA: Diagnosis not present

## 2014-08-17 DIAGNOSIS — N401 Enlarged prostate with lower urinary tract symptoms: Secondary | ICD-10-CM | POA: Diagnosis not present

## 2014-11-16 ENCOUNTER — Telehealth: Payer: Self-pay | Admitting: Internal Medicine

## 2014-11-16 NOTE — Telephone Encounter (Signed)
s.w. pt and advised on 6.6 appt moved to 6.7 due to MD out of the office.Marland KitchenMarland KitchenMarland KitchenMarland Kitchenpt ok and aware

## 2014-12-20 ENCOUNTER — Other Ambulatory Visit (HOSPITAL_BASED_OUTPATIENT_CLINIC_OR_DEPARTMENT_OTHER): Payer: Medicare Other

## 2014-12-20 ENCOUNTER — Ambulatory Visit (HOSPITAL_COMMUNITY)
Admission: RE | Admit: 2014-12-20 | Discharge: 2014-12-20 | Disposition: A | Discharge status: Home / Self Care | Payer: Medicare Other | Source: Ambulatory Visit | Attending: Internal Medicine | Admitting: Internal Medicine

## 2014-12-20 ENCOUNTER — Encounter (HOSPITAL_COMMUNITY): Payer: Self-pay

## 2014-12-20 DIAGNOSIS — R911 Solitary pulmonary nodule: Secondary | ICD-10-CM | POA: Diagnosis not present

## 2014-12-20 DIAGNOSIS — C3432 Malignant neoplasm of lower lobe, left bronchus or lung: Secondary | ICD-10-CM | POA: Diagnosis present

## 2014-12-20 DIAGNOSIS — J439 Emphysema, unspecified: Secondary | ICD-10-CM | POA: Diagnosis not present

## 2014-12-20 DIAGNOSIS — R918 Other nonspecific abnormal finding of lung field: Secondary | ICD-10-CM | POA: Diagnosis not present

## 2014-12-20 DIAGNOSIS — J9809 Other diseases of bronchus, not elsewhere classified: Secondary | ICD-10-CM | POA: Diagnosis not present

## 2014-12-20 LAB — CBC WITH DIFFERENTIAL/PLATELET
BASO%: 1.1 % (ref 0.0–2.0)
BASOS ABS: 0 10*3/uL (ref 0.0–0.1)
EOS ABS: 0.1 10*3/uL (ref 0.0–0.5)
EOS%: 3.3 % (ref 0.0–7.0)
HCT: 40.1 % (ref 38.4–49.9)
HGB: 13.4 g/dL (ref 13.0–17.1)
LYMPH%: 54.4 % — AB (ref 14.0–49.0)
MCH: 32.4 pg (ref 27.2–33.4)
MCHC: 33.4 g/dL (ref 32.0–36.0)
MCV: 97.1 fL (ref 79.3–98.0)
MONO#: 0.3 10*3/uL (ref 0.1–0.9)
MONO%: 8.8 % (ref 0.0–14.0)
NEUT%: 32.4 % — ABNORMAL LOW (ref 39.0–75.0)
NEUTROS ABS: 1.2 10*3/uL — AB (ref 1.5–6.5)
Platelets: 112 10*3/uL — ABNORMAL LOW (ref 140–400)
RBC: 4.13 10*6/uL — AB (ref 4.20–5.82)
RDW: 15.4 % — AB (ref 11.0–14.6)
WBC: 3.6 10*3/uL — ABNORMAL LOW (ref 4.0–10.3)
lymph#: 2 10*3/uL (ref 0.9–3.3)

## 2014-12-20 LAB — COMPREHENSIVE METABOLIC PANEL (CC13)
ALT: 13 U/L (ref 0–55)
AST: 28 U/L (ref 5–34)
Albumin: 3.2 g/dL — ABNORMAL LOW (ref 3.5–5.0)
Alkaline Phosphatase: 35 U/L — ABNORMAL LOW (ref 40–150)
Anion Gap: 9 mEq/L (ref 3–11)
BILIRUBIN TOTAL: 0.37 mg/dL (ref 0.20–1.20)
BUN: 9.9 mg/dL (ref 7.0–26.0)
CO2: 27 mEq/L (ref 22–29)
CREATININE: 0.8 mg/dL (ref 0.7–1.3)
Calcium: 8.5 mg/dL (ref 8.4–10.4)
Chloride: 104 mEq/L (ref 98–109)
GLUCOSE: 90 mg/dL (ref 70–140)
Potassium: 4.1 mEq/L (ref 3.5–5.1)
SODIUM: 140 meq/L (ref 136–145)
Total Protein: 7.2 g/dL (ref 6.4–8.3)

## 2014-12-20 MED ORDER — IOHEXOL 300 MG/ML  SOLN
100.0000 mL | Freq: Once | INTRAMUSCULAR | Status: AC | PRN
  Administered 2014-12-20: 80 mL via INTRAVENOUS

## 2014-12-26 ENCOUNTER — Ambulatory Visit: Payer: Medicaid Other | Admitting: Internal Medicine

## 2014-12-27 ENCOUNTER — Ambulatory Visit: Payer: Medicare Other | Admitting: Thoracic Surgery (Cardiothoracic Vascular Surgery)

## 2014-12-27 ENCOUNTER — Ambulatory Visit: Payer: Medicaid Other | Admitting: Internal Medicine

## 2014-12-28 ENCOUNTER — Telehealth: Payer: Self-pay | Admitting: *Deleted

## 2015-06-02 IMAGING — CT CT CHEST W/ CM
2 of 4 series · 15 of 36 positions shown, 18 images · IV contrast (OMNIPAQUE)
Comparison: 06/21/2014

CLINICAL DATA: Followup pulmonary nodules

EXAM:
CT CHEST WITH CONTRAST
TECHNIQUE: Multidetector CT imaging of the chest was performed during
intravenous contrast administration.
CONTRAST:  80mL OMNIPAQUE IOHEXOL 300 MG/ML  SOLN

[Series 2: chest with st · axial · 0.72mm/px · z∈[-344,-44]mm · 12 of 71 slices shown, 15 images]
[im 6/71  mediastinal]
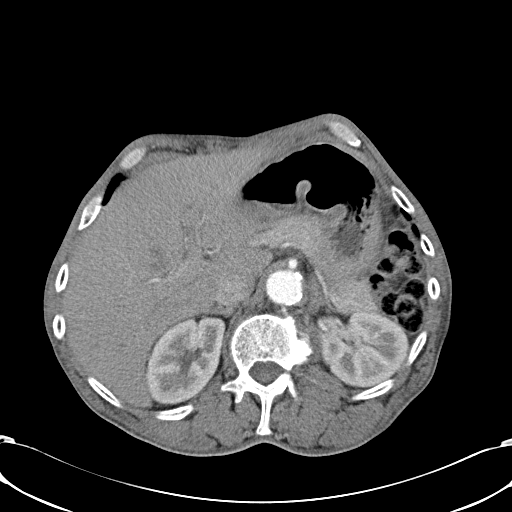
[im 6/71  lung]
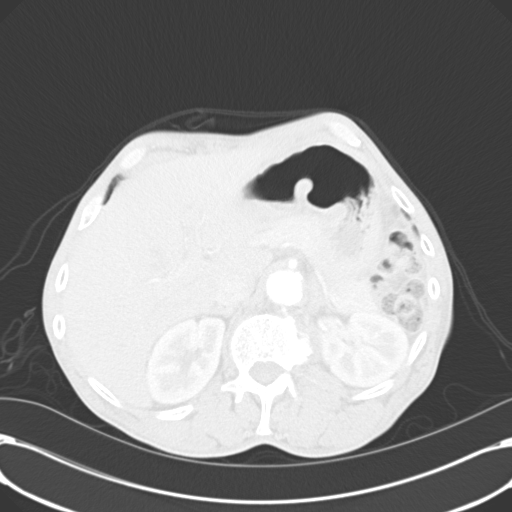
[im 11/71  lung]
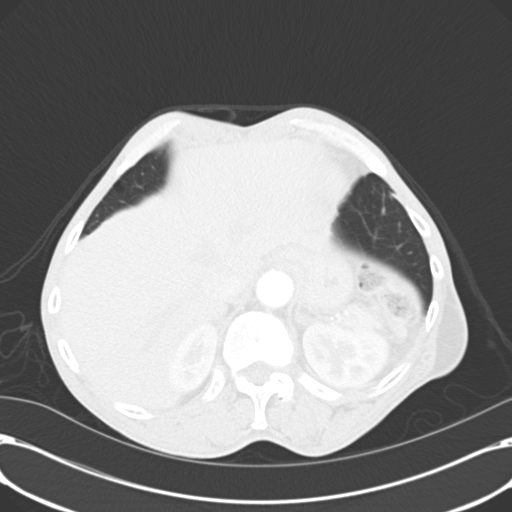
[im 16/71  lung]
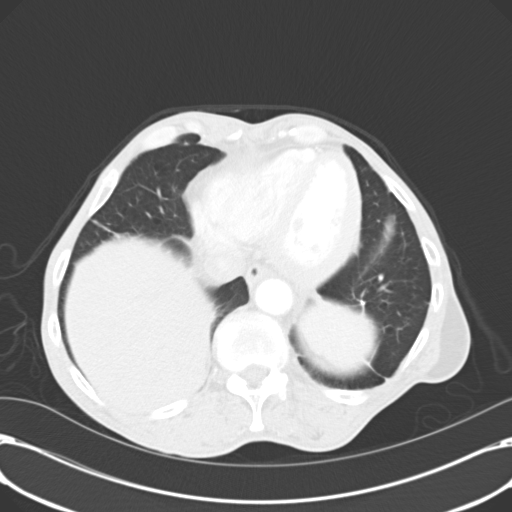
[im 21/71  lung]
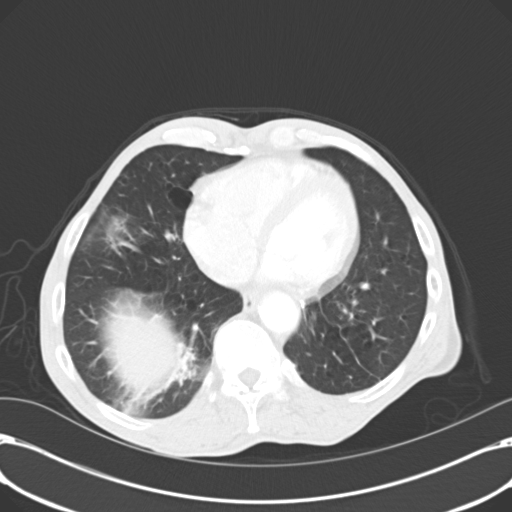
[im 26/71  mediastinal]
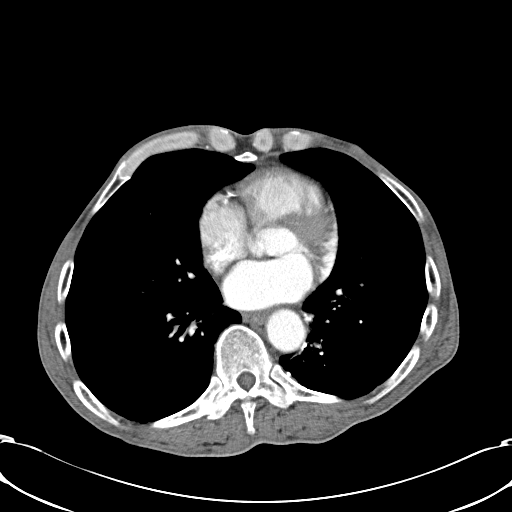
[im 26/71  lung]
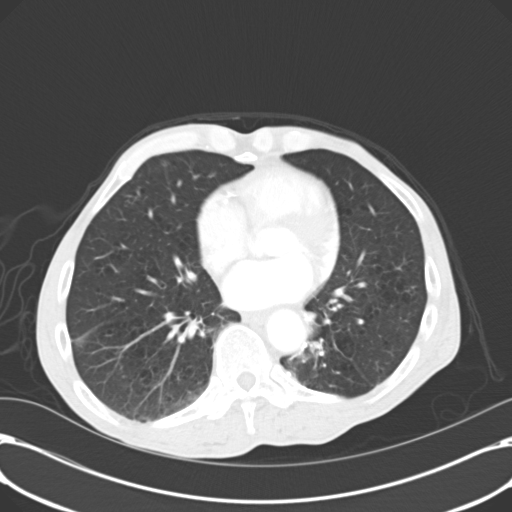
[im 31/71  lung]
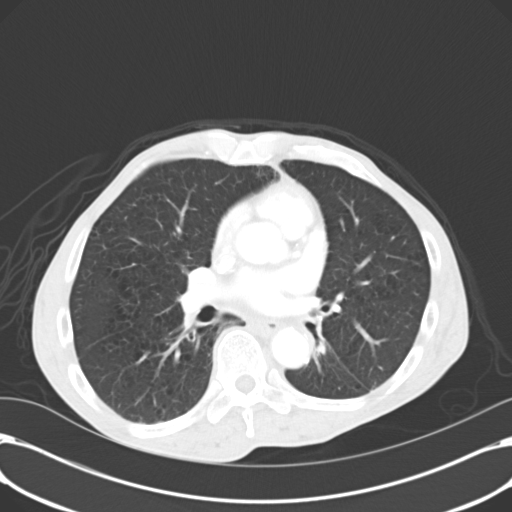
[im 41/71  lung]
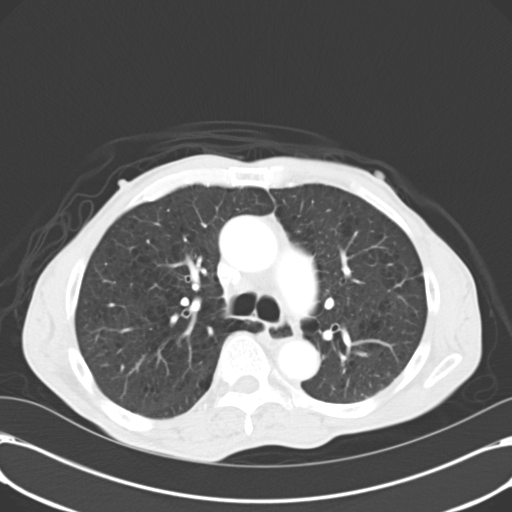
[im 46/71  lung]
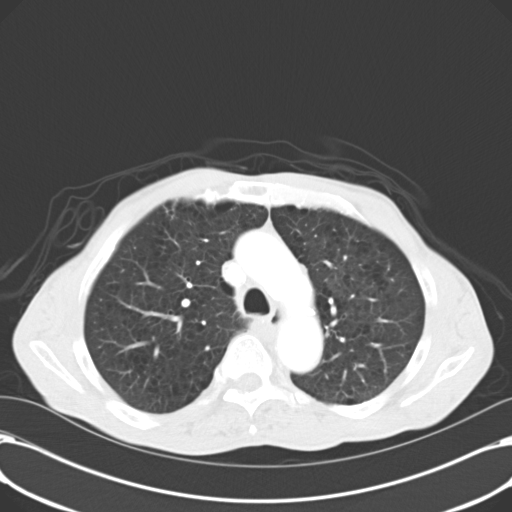
[im 51/71  mediastinal]
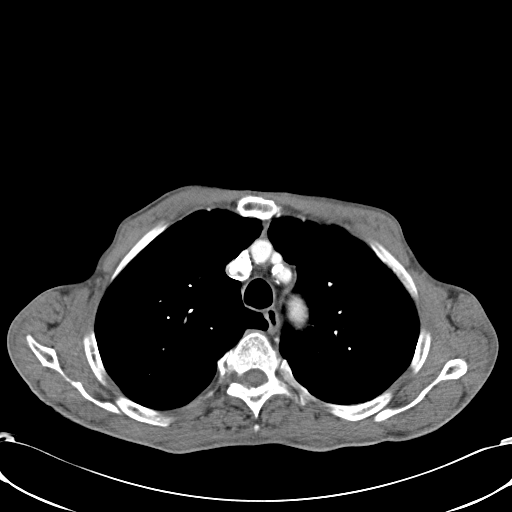
[im 51/71  lung]
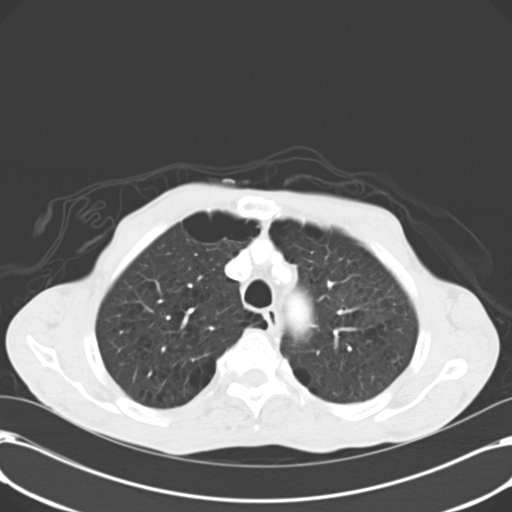
[im 56/71  lung]
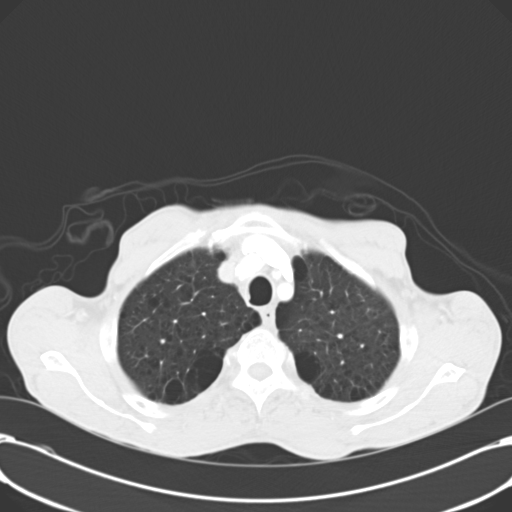
[im 61/71  lung]
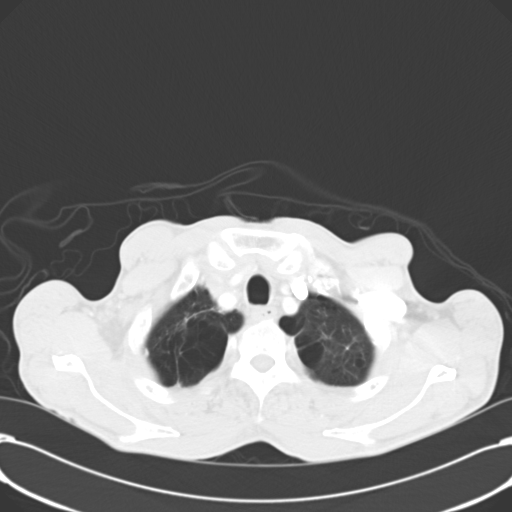
[im 66/71  lung]
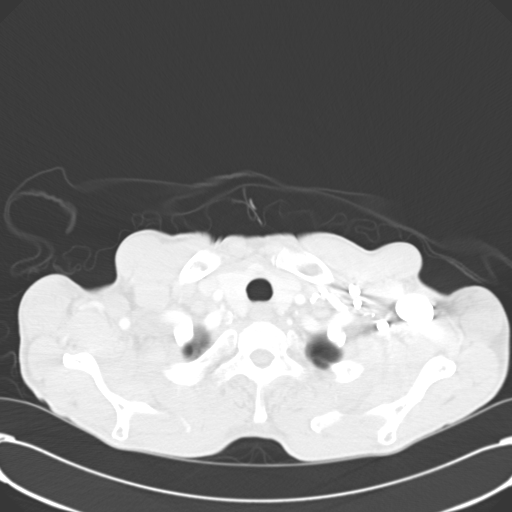

[Series 602: cor · coronal · 0.72mm/px · 3 of 79 slices shown]
[im 16/79  lung]
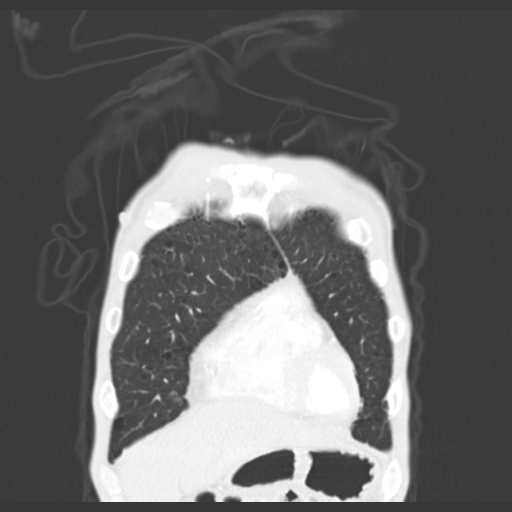
[im 32/79  lung]
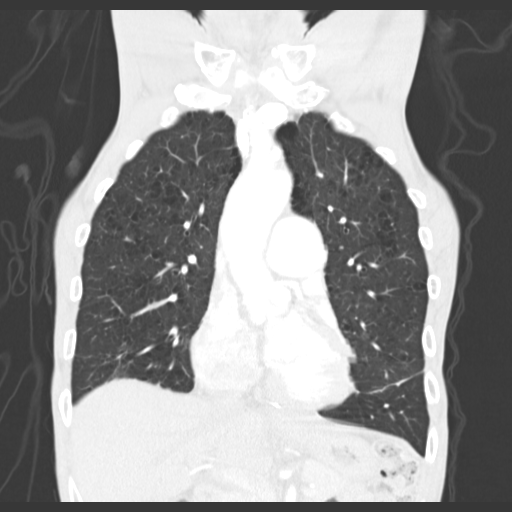
[im 47/79  lung]
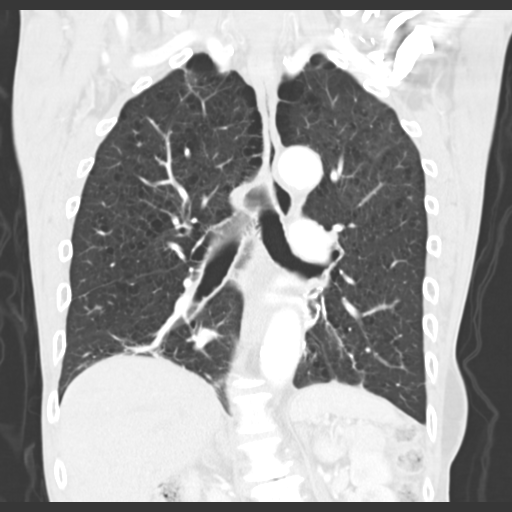

[15 of 36 positions shown; findings below may reference images not displayed]

FINDINGS: Mediastinum: The heart size is normal. There is no pericardial
effusion identified. No mediastinal or hilar adenopathy. There is
aortic atherosclerosis. Calcification within the RCA Coronary artery
noted.

Lungs/Pleura: No pleural effusion. Advanced changes of centrilobular
and paraseptal emphysema. There is diffuse bronchial wall
thickening. Right upper lobe pulmonary nodule measures 1 cm, image
26/series 5. Previously 8 mm. Scar like opacity in the right middle
lobe is unchanged, image 48/series 5.

Upper Abdomen: There is no suspicious liver abnormality identified.
The visualized portions of the gallbladder are normal. The adrenal
glands are unremarkable.

Musculoskeletal: No aggressive lytic or sclerotic bone lesions.
IMPRESSION: 1. Pulmonary nodule in the right lung has increased in size from
previous exam. This now measures 1 cm. This is concerning for
bronchogenic carcinoma. Further assessment with PET-CT is
recommended.
2. Aortic atherosclerosis as well as coronary artery calcification.
3. Diffuse bronchial wall thickening with emphysema, as above;
imaging findings suggestive of underlying COPD.

## 2020-02-20 DEATH — deceased
